# Patient Record
Sex: Male | Born: 1985 | Race: Black or African American | Hispanic: No | Smoking: Never smoker
Health system: Southern US, Community
[De-identification: ages and names within clinical notes are randomized; demographics above are authoritative.]

## PROBLEM LIST (undated history)

## (undated) VITALS — BP 145/83 | HR 63 | Temp 98.5°F | Resp 12

## (undated) DIAGNOSIS — F4310 Post-traumatic stress disorder, unspecified: Secondary | ICD-10-CM

## (undated) DIAGNOSIS — G43909 Migraine, unspecified, not intractable, without status migrainosus: Secondary | ICD-10-CM

## (undated) DIAGNOSIS — M419 Scoliosis, unspecified: Secondary | ICD-10-CM

## (undated) HISTORY — PX: NO PAST SURGERIES: SHX2092

---

## 2016-11-25 ENCOUNTER — Emergency Department
Admission: EM | Admit: 2016-11-25 | Discharge: 2016-11-25 | Disposition: A | Discharge status: Home / Self Care | Payer: Self-pay | Attending: Emergency Medicine | Admitting: Emergency Medicine

## 2016-11-25 ENCOUNTER — Other Ambulatory Visit: Payer: Self-pay

## 2016-11-25 ENCOUNTER — Encounter: Payer: Self-pay | Admitting: *Deleted

## 2016-11-25 DIAGNOSIS — Y9389 Activity, other specified: Secondary | ICD-10-CM | POA: Insufficient documentation

## 2016-11-25 DIAGNOSIS — Y999 Unspecified external cause status: Secondary | ICD-10-CM | POA: Diagnosis not present

## 2016-11-25 DIAGNOSIS — S161XXA Strain of muscle, fascia and tendon at neck level, initial encounter: Secondary | ICD-10-CM | POA: Insufficient documentation

## 2016-11-25 DIAGNOSIS — Y92410 Unspecified street and highway as the place of occurrence of the external cause: Secondary | ICD-10-CM | POA: Diagnosis not present

## 2016-11-25 DIAGNOSIS — S199XXA Unspecified injury of neck, initial encounter: Secondary | ICD-10-CM | POA: Diagnosis present

## 2016-11-25 HISTORY — DX: Scoliosis, unspecified: M41.9

## 2016-11-25 MED ORDER — CYCLOBENZAPRINE HCL 5 MG PO TABS: 5.0000 mg | ORAL_TABLET | Freq: Three times a day (TID) | ORAL | 0 refills | Status: DC | PRN

## 2016-11-25 MED ORDER — IBUPROFEN 800 MG PO TABS: 800.0000 mg | ORAL_TABLET | Freq: Three times a day (TID) | ORAL | 0 refills | Status: DC | PRN
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
## 2016-11-25 NOTE — Discharge Instructions (Signed)
Please take medications as prescribed.  Ice cervical spine muscles 20 minutes every hour for the next 2 days then transition over to heat.  Follow-up with orthopedics if no improvement in 5-7 days.
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
## 2016-12-09 ENCOUNTER — Ambulatory Visit
Admission: RE | Admit: 2016-12-09 | Discharge: 2016-12-09 | Disposition: A | Discharge status: Home / Self Care | Payer: No Typology Code available for payment source | Source: Ambulatory Visit | Attending: Chiropractor | Admitting: Chiropractor

## 2016-12-09 ENCOUNTER — Other Ambulatory Visit: Payer: Self-pay | Admitting: Chiropractor

## 2016-12-09 DIAGNOSIS — M419 Scoliosis, unspecified: Secondary | ICD-10-CM | POA: Insufficient documentation

## 2016-12-09 DIAGNOSIS — R52 Pain, unspecified: Secondary | ICD-10-CM | POA: Diagnosis not present

## 2016-12-24 ENCOUNTER — Encounter: Payer: Self-pay | Admitting: Emergency Medicine

## 2016-12-24 ENCOUNTER — Other Ambulatory Visit: Payer: Self-pay

## 2016-12-24 ENCOUNTER — Emergency Department
Admission: EM | Admit: 2016-12-24 | Discharge: 2016-12-24 | Disposition: A | Discharge status: Home / Self Care | Payer: No Typology Code available for payment source | Attending: Emergency Medicine | Admitting: Emergency Medicine

## 2016-12-24 DIAGNOSIS — M549 Dorsalgia, unspecified: Secondary | ICD-10-CM | POA: Diagnosis present

## 2016-12-24 DIAGNOSIS — M62830 Muscle spasm of back: Secondary | ICD-10-CM | POA: Insufficient documentation

## 2016-12-24 MED ORDER — HYDROCODONE-ACETAMINOPHEN 5-325 MG PO TABS
2.0000 | ORAL_TABLET | Freq: Once | ORAL | Status: AC
  Administered 2016-12-24: 2 via ORAL
  Filled 2016-12-24: qty 2

## 2016-12-24 MED ORDER — ORPHENADRINE CITRATE 30 MG/ML IJ SOLN
60.0000 mg | Freq: Two times a day (BID) | INTRAMUSCULAR | Status: DC
  Administered 2016-12-24: 60 mg via INTRAMUSCULAR
  Filled 2016-12-24: qty 2

## 2016-12-24 MED ORDER — METHOCARBAMOL 500 MG PO TABS: 1000.0000 mg | ORAL_TABLET | Freq: Four times a day (QID) | ORAL | 0 refills | Status: DC

## 2016-12-24 MED ORDER — HYDROCODONE-ACETAMINOPHEN 5-325 MG PO TABS: 1.0000 | ORAL_TABLET | Freq: Four times a day (QID) | ORAL | 0 refills | Status: DC | PRN
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
## 2016-12-24 NOTE — ED Notes (Signed)

## 2017-08-29 ENCOUNTER — Emergency Department
Admission: EM | Admit: 2017-08-29 | Discharge: 2017-08-29 | Disposition: A | Discharge status: Home / Self Care | Payer: Self-pay | Attending: Emergency Medicine | Admitting: Emergency Medicine

## 2017-08-29 ENCOUNTER — Encounter: Payer: Self-pay | Admitting: Emergency Medicine

## 2017-08-29 ENCOUNTER — Emergency Department: Payer: Self-pay

## 2017-08-29 DIAGNOSIS — R0789 Other chest pain: Secondary | ICD-10-CM | POA: Insufficient documentation

## 2017-08-29 LAB — CBC WITH DIFFERENTIAL/PLATELET
Basophils Absolute: 0 10*3/uL (ref 0–0.1)
Basophils Relative: 1 %
Eosinophils Absolute: 0.1 10*3/uL (ref 0–0.7)
Eosinophils Relative: 1 %
HCT: 44.1 % (ref 40.0–52.0)
HEMOGLOBIN: 14.8 g/dL (ref 13.0–18.0)
LYMPHS ABS: 2.2 10*3/uL (ref 1.0–3.6)
Lymphocytes Relative: 36 %
MCH: 24.2 pg — AB (ref 26.0–34.0)
MCHC: 33.5 g/dL (ref 32.0–36.0)
MCV: 72.2 fL — AB (ref 80.0–100.0)
MONOS PCT: 10 %
Monocytes Absolute: 0.6 10*3/uL (ref 0.2–1.0)
NEUTROS PCT: 52 %
Neutro Abs: 3.2 10*3/uL (ref 1.4–6.5)
PLATELETS: 158 10*3/uL (ref 150–440)
RBC: 6.11 MIL/uL — AB (ref 4.40–5.90)
RDW: 14.5 % (ref 11.5–14.5)
WBC: 6.1 10*3/uL (ref 3.8–10.6)

## 2017-08-29 LAB — COMPREHENSIVE METABOLIC PANEL
ALBUMIN: 4.2 g/dL (ref 3.5–5.0)
ALT: 12 U/L (ref 0–44)
ANION GAP: 7 (ref 5–15)
AST: 19 U/L (ref 15–41)
Alkaline Phosphatase: 80 U/L (ref 38–126)
BUN: 16 mg/dL (ref 6–20)
CHLORIDE: 105 mmol/L (ref 98–111)
CO2: 26 mmol/L (ref 22–32)
Calcium: 9.6 mg/dL (ref 8.9–10.3)
Creatinine, Ser: 1.12 mg/dL (ref 0.61–1.24)
GFR calc Af Amer: >60 mL/min (ref >60)
GFR calc non Af Amer: >60 mL/min (ref >60)
GLUCOSE: 90 mg/dL (ref 70–99)
Potassium: 3.8 mmol/L (ref 3.5–5.1)
SODIUM: 138 mmol/L (ref 135–145)
Total Bilirubin: 0.6 mg/dL (ref 0.3–1.2)
Total Protein: 8 g/dL (ref 6.5–8.1)

## 2017-08-29 LAB — TROPONIN I: Troponin I: <0.03 ng/mL (ref <0.03)

## 2017-08-29 MED ORDER — DICLOFENAC SODIUM 1 % TD GEL: TRANSDERMAL | 0 refills | Status: DC

## 2017-08-29 MED ORDER — KETOROLAC TROMETHAMINE 30 MG/ML IJ SOLN
30.0000 mg | Freq: Once | INTRAMUSCULAR | Status: AC
  Administered 2017-08-29: 30 mg via INTRAMUSCULAR
  Filled 2017-08-29: qty 1
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
## 2018-08-29 ENCOUNTER — Other Ambulatory Visit: Payer: Self-pay

## 2018-08-29 DIAGNOSIS — Z20822 Contact with and (suspected) exposure to covid-19: Secondary | ICD-10-CM

## 2018-08-30 LAB — NOVEL CORONAVIRUS, NAA: SARS-CoV-2, NAA: NOT DETECTED

## 2019-01-08 ENCOUNTER — Ambulatory Visit: Payer: Self-pay | Attending: Internal Medicine

## 2019-01-08 DIAGNOSIS — Z20822 Contact with and (suspected) exposure to covid-19: Secondary | ICD-10-CM

## 2019-01-10 LAB — NOVEL CORONAVIRUS, NAA: SARS-CoV-2, NAA: NOT DETECTED
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
## 2019-07-18 NOTE — Progress Notes (Signed)
07/19/2019 11:31 AM   Sean King 08/21/1985 [id number]  Referring provider: No referring provider defined for this encounter. Chief Complaint  Patient presents with   VAS Consult    HPI: Sean King is a 34 y.o. male who presents today for a vasectomy consult.   -Married without children -States he and wife desire vasectomy as a means of permanent sterilization -Known history epididymitis/chronic scrotal pain -Denies any prior urological symptoms/surgeries. -No history of bleeding or clotting disorders   PMH: Past Medical History:  Diagnosis Date   Scoliosis     Surgical History: No past surgical history on file.  Home Medications:  Allergies as of 07/19/2019      Reactions   Coconut Oil    Hydrocortisone Hives   Neosporin [neomycin-bacitracin Zn-polymyx] Rash      Medication List       Accurate as of July 19, 2019 11:31 AM. If you have any questions, ask your nurse or doctor.        STOP taking these medications   HYDROcodone-acetaminophen 5-325 MG tablet Commonly known as: NORCO/VICODIN Stopped by: Riki Altes, MD     TAKE these medications   diclofenac sodium 1 % Gel Commonly known as: VOLTAREN Apply to affected area 3-4 times per day as needed for pain.   methocarbamol 500 MG tablet Commonly known as: Robaxin Take 2 tablets (1,000 mg total) by mouth 4 (four) times daily.       Allergies:  Allergies  Allergen Reactions   Coconut Oil    Hydrocortisone Hives   Neosporin [Neomycin-Bacitracin Zn-Polymyx] Rash    Family History: No family history on file.  Social History:  reports that he has never smoked. He has never used smokeless tobacco. He reports current alcohol use. He reports that he does not use drugs.   Physical Exam: BP (!) 155/104    Pulse 87    Ht 6' (1.829 m)    Wt 247 lb (112 kg)    BMI 33.50 kg/m   Constitutional:  Alert and oriented, No acute distress. HEENT: [place] AT, moist mucus membranes.  Trachea  midline, no masses. Cardiovascular: No clubbing, cyanosis, or edema. Respiratory: Normal respiratory effort, no increased work of breathing. GU: Phallus without lesions, testes descended bilaterally without masses or tenderness, spermatic cord/epididymis palpably normal bilaterally.  Vasa easily palpable. Skin: No rashes, bruises or suspicious lesions. Neurologic: Grossly intact, no focal deficits, moving all 4 extremities. Psychiatric: Normal mood and affect.   Assessment & Plan:    We had a long discussion about vasectomy. We specifically discussed the procedure, recovery and the risks, benefits and alternatives of vasectomy. I explained that the procedure entails removal of a segment of each vas deferens, each of which conducts sperm, and that the purpose of this procedure is to cause sterility (inability to produce children or cause pregnancy). Vasectomy is intended to be permanent and irreversible form of contraception. Options for fertility after vasectomy include vasectomy reversal, or sperm retrieval with in vitro fertilization. These options are not always successful, and they may be expensive. We discussed reversible forms of birth control such as condoms, IUD or diaphragms, as well as the option of freezing sperm in a sperm bank prior to the vasectomy procedure. We discussed the importance of avoiding strenuous exercise for four days after vasectomy, and the importance of refraining from any form of ejaculation for seven days after vasectomy. I explained that vasectomy does not produce immediate sterility so another form of contraceptive must be  used until sterility is assured by having semen checked for sperm. Thus, a post vasectomy semen analysis is necessary to confirm sterility. Rarely, vasectomy must be repeated. We discussed the approximately 1 in 2,000 risk of pregnancy after vasectomy for men who have post-vasectomy semen analysis showing absent sperm or rare non-motile sperm. Typical  side effects include a small amount of oozing blood, some discomfort and mild swelling in the area of incision.  Vasectomy does not affect sexual performance, function, please, sensation, interest, desire, satisfaction, penile erection, volume of semen or ejaculation. Other rare risks include allergy or adverse reaction to an anesthetic, testicular atrophy, hematoma, infection/abscess, prolonged tenderness of the vas deferens, pain, swelling, painful nodule or scar (called sperm granuloma) or epididymtis. We discussed chronic testicular pain syndrome. This has been reported to occur in as many as 1-2% of men and may be permanent. This can be treated with medication, small procedures or (rarely) surgery.  He indicated he would call back if preprocedure anxiolytic is desired  [facility] [address] [place], [place] [address] [phone]  I, Theador Hawthorne, am acting as a scribe for Dr. Lorin Picket C. Brenna Friesenhahn,  I have reviewed the above documentation for accuracy and completeness, and I agree with the above.    Riki Altes, MD

## 2019-07-19 ENCOUNTER — Ambulatory Visit: Payer: BC Managed Care – PPO | Admitting: Urology

## 2019-07-19 ENCOUNTER — Encounter: Payer: Self-pay | Admitting: Urology

## 2019-07-19 ENCOUNTER — Other Ambulatory Visit: Payer: Self-pay

## 2019-07-19 VITALS — BP 155/104 | HR 87 | Ht 72.0 in | Wt 247.0 lb

## 2019-07-19 DIAGNOSIS — Z3009 Encounter for other general counseling and advice on contraception: Secondary | ICD-10-CM | POA: Diagnosis not present

## 2019-07-19 NOTE — Patient Instructions (Signed)

## 2019-07-21 ENCOUNTER — Encounter: Payer: Self-pay | Admitting: Urology

## 2019-08-29 ENCOUNTER — Encounter: Payer: Self-pay | Admitting: Urology

## 2019-09-05 ENCOUNTER — Encounter: Payer: Self-pay | Admitting: Urology

## 2019-11-08 ENCOUNTER — Encounter: Payer: BC Managed Care – PPO | Admitting: Urology

## 2019-11-19 ENCOUNTER — Emergency Department (HOSPITAL_COMMUNITY)
Admission: EM | Admit: 2019-11-19 | Discharge: 2019-11-19 | Disposition: A | Discharge status: Home / Self Care | Payer: BC Managed Care – PPO | Attending: Emergency Medicine | Admitting: Emergency Medicine

## 2019-11-19 ENCOUNTER — Other Ambulatory Visit: Payer: Self-pay

## 2019-11-19 ENCOUNTER — Encounter (HOSPITAL_COMMUNITY): Payer: Self-pay

## 2019-11-19 DIAGNOSIS — S3992XA Unspecified injury of lower back, initial encounter: Secondary | ICD-10-CM | POA: Diagnosis present

## 2019-11-19 DIAGNOSIS — X58XXXA Exposure to other specified factors, initial encounter: Secondary | ICD-10-CM | POA: Diagnosis not present

## 2019-11-19 DIAGNOSIS — S29019A Strain of muscle and tendon of unspecified wall of thorax, initial encounter: Secondary | ICD-10-CM

## 2019-11-19 DIAGNOSIS — S29012A Strain of muscle and tendon of back wall of thorax, initial encounter: Secondary | ICD-10-CM | POA: Insufficient documentation

## 2019-11-19 HISTORY — DX: Post-traumatic stress disorder, unspecified: F43.10

## 2019-11-19 MED ORDER — NAPROXEN 375 MG PO TABS: 375.0000 mg | ORAL_TABLET | Freq: Two times a day (BID) | ORAL | 0 refills | Status: DC | PRN

## 2019-11-19 MED ORDER — METHOCARBAMOL 750 MG PO TABS: 1500.0000 mg | ORAL_TABLET | Freq: Three times a day (TID) | ORAL | 0 refills | Status: DC | PRN

## 2019-11-19 MED ORDER — OXYCODONE-ACETAMINOPHEN 5-325 MG PO TABS
1.0000 | ORAL_TABLET | Freq: Once | ORAL | Status: AC
  Administered 2019-11-19: 1 via ORAL
  Filled 2019-11-19: qty 1

## 2019-11-19 MED ORDER — KETOROLAC TROMETHAMINE 60 MG/2ML IM SOLN
60.0000 mg | Freq: Once | INTRAMUSCULAR | Status: AC
  Administered 2019-11-19: 60 mg via INTRAMUSCULAR
  Filled 2019-11-19: qty 2

## 2019-11-19 NOTE — ED Triage Notes (Signed)
Pt reports 5 days of left upper back pain, hx of scoliosis. Pt tried icy hot without relief. Pt ambulatory.

## 2019-11-19 NOTE — ED Provider Notes (Signed)
[facility] EMERGENCY DEPARTMENT Provider Note   CSN: [id number] Arrival date & time: 11/19/19  1634     History Chief Complaint  Patient presents with  . Back Pain    Sean King is a 34 y.o. male.  The history is provided by the patient and medical records.  Back Pain  Sean King is a 34 y.o. male who presents to the Emergency Department complaining of back pain. He presents the emergency department complaining of five days of acute on chronic upper back pain. He has a history of scoliosis with recurrent back pain. Over the last several days he is noticed worsening of this pain. He has a sharp pain at the base of his neck that radiates down to his scapula. He feels like there is a ball underneath the scapula. He denies any associated fevers, nausea, vomiting, chest pain, shortness of breath, numbness, weakness. He is not trying thing at home for pain. He has been seen previously for similar episodes and has been prescribed muscle relaxers but feels like these make him sleepy.    Past Medical History:  Diagnosis Date  . PTSD (post-traumatic stress disorder)   . Scoliosis     There are no problems to display for this patient.   History reviewed. No pertinent surgical history.     No family history on file.  Social History   Tobacco Use  . Smoking status: Never Smoker  . Smokeless tobacco: Never Used  Vaping Use  . Vaping Use: Never used  Substance Use Topics  . Alcohol use: Yes    Comment: sparingly  . Drug use: No    Home Medications Prior to Admission medications   Medication Sig Start Date End Date Taking? Authorizing Provider  Menthol, Topical Analgesic, (ICY HOT BACK EX) Apply 1 application topically daily as needed (For back pain).   Yes [provider]  diclofenac sodium (VOLTAREN) 1 % GEL Apply to affected area 3-4 times per day as needed for pain. Patient not taking: Reported on 11/19/2019 08/29/17   Myrna Blazer, MD  methocarbamol (ROBAXIN) 750 MG tablet Take 2 tablets (1,500 mg total) by mouth every 8 (eight) hours as needed for muscle spasms. 11/19/19   Tilden Fossa, MD  naproxen (NAPROSYN) 375 MG tablet Take 1 tablet (375 mg total) by mouth 2 (two) times daily as needed. 11/19/19   Tilden Fossa, MD    Allergies    Coconut oil, Hydrocortisone, and Neosporin [neomycin-bacitracin zn-polymyx]  Review of Systems   Review of Systems  Musculoskeletal: Positive for back pain.  All other systems reviewed and are negative.   Physical Exam Updated Vital Signs BP (!) 166/98 (BP Location: Left Arm)   Pulse 66   Temp 98.1 F (36.7 C) (Oral)   Resp 18   Ht 6' (1.829 m)   Wt 108.9 kg   SpO2 98%   BMI 32.55 kg/m   Physical Exam Vitals and nursing note reviewed.  Constitutional:      Appearance: He is well-developed.  HENT:     Head: Normocephalic and atraumatic.  Cardiovascular:     Rate and Rhythm: Normal rate and regular rhythm.     Heart sounds: No murmur heard.   Pulmonary:     Effort: Pulmonary effort is normal. No respiratory distress.     Breath sounds: Normal breath sounds.  Abdominal:     Palpations: Abdomen is soft.     Tenderness: There is no abdominal tenderness. There is no  guarding or rebound.  Musculoskeletal:     Comments: No focal point bony tenderness over the cervical, thoracic, lumbar spine. There is mild tenderness to palpation just inferior to the left scapula. Range of motion intact in bilateral shoulders.  Skin:    General: Skin is warm and dry.  Neurological:     Mental Status: He is alert and oriented to person, place, and time.     Comments: Five out of five strength in all four extremities with sensation to light touch intact in all four extremities  Psychiatric:        Behavior: Behavior normal.     ED Results / Procedures / Treatments   Labs (all labs ordered are listed, but only abnormal results are displayed) Labs Reviewed - No data to  display  EKG None  Radiology No results found.  Procedures Procedures (including critical care time)  Medications Ordered in ED Medications  ketorolac (TORADOL) injection 60 mg (has no administration in time range)  oxyCODONE-acetaminophen (PERCOCET/ROXICET) 5-325 MG per tablet 1 tablet (1 tablet Oral Given 11/19/19 1646)    ED Course  I have reviewed the triage vital signs and the nursing notes.  Pertinent labs & imaging results that were available during my care of the patient were reviewed by me and considered in my medical decision making (see chart for details).    MDM Rules/Calculators/A&P                         patient here for evaluation of left sided thoracic back pain. Pain is reproducible on examination. He has no focal neurologic deficits. Presentation is not consistent with PE, pneumothorax, epidural abscess, dissection. Discussed with patient home care for musculoskeletal back pain. Discussed outpatient follow-up and return precautions.  Final Clinical Impression(s) / ED Diagnoses Final diagnoses:  Thoracic myofascial strain, initial encounter    Rx / DC Orders ED Discharge Orders         Ordered    naproxen (NAPROSYN) 375 MG tablet  2 times daily PRN        11/19/19 1849    methocarbamol (ROBAXIN) 750 MG tablet  Every 8 hours PRN        11/19/19 1849           Tilden Fossa, MD 11/19/19 [phone]

## 2019-11-19 NOTE — ED Notes (Signed)
Patient verbalizes understanding of discharge instructions. Opportunity for questioning and answers were provided. Arm band removed by staff, patient discharged from ED. 

## 2019-12-02 ENCOUNTER — Telehealth: Payer: Self-pay | Admitting: Family Medicine

## 2019-12-02 NOTE — Telephone Encounter (Signed)
Per patient not a good time to scheduled , appt declined for now but states will call our office when available for f/u--glh

## 2020-01-04 ENCOUNTER — Telehealth: Payer: BC Managed Care – PPO | Admitting: Nurse Practitioner

## 2020-01-04 DIAGNOSIS — Z20822 Contact with and (suspected) exposure to covid-19: Secondary | ICD-10-CM

## 2020-01-04 NOTE — Progress Notes (Signed)
E-Visit for Corona Virus Screening  Your current symptoms could be consistent with the coronavirus.  Many health care providers can now test patients at their office but not all are.  [facility] has multiple testing sites. For information on our COVID testing locations and hours go to [email or web address]    Testing Information: The COVID-19 Community Testing sites are testing BY APPOINTMENT ONLY.  You can schedule online at [email or web address]  If you do not have access to a smart phone or computer you may call [phone] for an appointment.   Additional testing sites in the Community:  . For [facility] Testing sites in [place]  https://www.cvs.com/minuteclinic/covid-19-testing  . For Pop-up testing sites in   https://covid19.ncdhhs.gov/about-covid-19/testing/find-my-testing-place/pop-testing-sites  . For [facility] https://[place].gov[place]/human-services/health-department/coronavirus-covid-19-info/covid-19-testing  . For [place] testing in [place] https://www.[place].gov/[place]/human-services/health-department/coronavirus-covid-19-info/covid-19-testing  . For [facility] testing in [place]   https://[facility].care/covidtesting  For  more information about community testing call [phone]   Please quarantine yourself while awaiting your test results. Please stay home for a minimum of 10 days from the first day of illness with improving symptoms and you have had 24 hours of no fever (without the use of Tylenol (Acetaminophen) Motrin (Ibuprofen) or any fever reducing medication).  Also - Do not get tested prior to returning to work because once you have had a positive test the test can stay positive for more than a month in some cases.   You should wear a mask or cloth face covering over your nose and mouth if you must be around other people or animals, including pets (even at home). Try  to stay at least 6 feet away from other people. This will protect the people around you.  Please continue good preventive care measures, including:  frequent hand-washing, avoid touching your face, cover coughs/sneezes, stay out of crowds and keep a 6 foot distance from others.  COVID-19 is a respiratory illness with symptoms that are similar to the flu. Symptoms are typically mild to moderate, but there have been cases of severe illness and death due to the virus.   The following symptoms may appear 2-14 days after exposure: . Fever . Cough . Shortness of breath or difficulty breathing . Chills . Repeated shaking with chills . Muscle pain . Headache . Sore throat . New loss of taste or smell . Fatigue . Congestion or runny nose . Nausea or vomiting . Diarrhea  Go to the nearest hospital ED for assessment if fever/cough/breathlessness are severe or illness seems like a threat to life.  It is vitally important that if you feel that you have an infection such as this virus or any other virus that you stay home and away from places where you may spread it to others.  You should avoid contact with people age 65 and older.   You can use medication such as A prescription cough medication called Tessalon Perles 100 mg. You may take 1-2 capsules every 8 hours as needed for cough  You may also take acetaminophen (Tylenol) as needed for fever.  Reduce your risk of any infection by using the same precautions used for avoiding the common cold or flu:  . Wash your hands often with soap and warm water for at least 20 seconds.  If soap and water are not readily available, use an alcohol-based hand sanitizer with at least 60% alcohol.  . If coughing or sneezing, cover your mouth and nose by coughing or sneezing into the elbow areas of   your shirt or coat, into a tissue or into your sleeve (not your hands). . Avoid shaking hands with others and consider head nods or verbal greetings only. . Avoid touching  your eyes, nose, or mouth with unwashed hands.  . Avoid close contact with people who are sick. . Avoid places or events with large numbers of people in one location, like concerts or sporting events. . Carefully consider travel plans you have or are making. . If you are planning any travel outside or inside the [place], visit the CDC's Travelers' Health webpage for the latest health notices. . If you have some symptoms but not all symptoms, continue to monitor at home and seek medical attention if your symptoms worsen. . If you are having a medical emergency, call 911.  HOME CARE . Only take medications as instructed by your medical team. . Drink plenty of fluids and get plenty of rest. . A steam or ultrasonic humidifier can help if you have congestion.   GET HELP RIGHT AWAY IF YOU HAVE EMERGENCY WARNING SIGNS** FOR COVID-19. If you or someone is showing any of these signs seek emergency medical care immediately. Call 911 or proceed to your closest emergency facility if: . You develop worsening high fever. . Trouble breathing . Bluish lips or face . Persistent pain or pressure in the chest . New confusion . Inability to wake or stay awake . You cough up blood. . Your symptoms become more severe  **This list is not all possible symptoms. Contact your medical provider for any symptoms that are sever or concerning to you.  MAKE SURE YOU   Understand these instructions.  Will watch your condition.  Will get help right away if you are not doing well or get worse.  Your e-visit answers were reviewed by a board certified advanced clinical practitioner to complete your personal care plan.  Depending on the condition, your plan could have included both over the counter or prescription medications.  If there is a problem please reply once you have received a response from your provider.  Your safety is important to us.  If you have drug allergies check your prescription carefully.    You can  use MyChart to ask questions about today's visit, request a non-urgent call back, or ask for a work or school excuse for 24 hours related to this e-Visit. If it has been greater than 24 hours you will need to follow up with your provider, or enter a new e-Visit to address those concerns. You will get an e-mail in the next two days asking about your experience.  I hope that your e-visit has been valuable and will speed your recovery. Thank you for using e-visits.  5-10 minutes spent reviewing and documenting in chart.   

## 2020-05-06 ENCOUNTER — Encounter: Payer: Self-pay | Admitting: Physician Assistant

## 2020-05-06 ENCOUNTER — Other Ambulatory Visit: Payer: Self-pay

## 2020-05-06 ENCOUNTER — Ambulatory Visit: Payer: Self-pay | Admitting: Physician Assistant

## 2020-05-06 DIAGNOSIS — Z113 Encounter for screening for infections with a predominantly sexual mode of transmission: Secondary | ICD-10-CM

## 2020-05-06 LAB — GRAM STAIN

## 2020-05-06 NOTE — Progress Notes (Signed)
   [facility] Department STI clinic/screening visit  Subjective:  Sean King is a 35 y.o. male being seen today for an STI screening visit. The patient reports they do not have symptoms.    Patient has the following medical conditions:  There are no problems to display for this patient.    Chief Complaint  Patient presents with  . SEXUALLY TRANSMITTED DISEASE    screening    HPI  Patient reports that he is not having any symptoms but would like a screening today.  Denies chronic conditions, surgeries and regular medicines.  States last HIV test was 2 years ago and las void prior to sample collection for Gram stain was over 2 hr ago.   See flowsheet for further details and programmatic requirements.    The following portions of the patient's history were reviewed and updated as appropriate: allergies, current medications, past medical history, past social history, past surgical history and problem list.  Objective:  There were no vitals filed for this visit.  Physical Exam Constitutional:      General: He is not in acute distress.    Appearance: Normal appearance.  HENT:     Head: Normocephalic and atraumatic.     Comments: No nits,lice, or hair loss. No cervical, supraclavicular or axillary adenopathy.    Mouth/Throat:     Mouth: Mucous membranes are moist.     Pharynx: Oropharynx is clear. No oropharyngeal exudate or posterior oropharyngeal erythema.  Eyes:     Conjunctiva/sclera: Conjunctivae normal.  Pulmonary:     Effort: Pulmonary effort is normal.  Abdominal:     Palpations: Abdomen is soft. There is no mass.     Tenderness: There is no abdominal tenderness. There is no guarding or rebound.  Genitourinary:    Penis: Normal.      Testes: Normal.     Comments: Pubic area without nits, lice, hair loss, edema, erythema, lesions and inguinal adenopathy. Penis circumcised without rash, lesions and discharge at meatus. Testicles descended  bilaterally,nt, no masses or edema. Musculoskeletal:     Cervical back: Neck supple. No tenderness.  Skin:    General: Skin is warm and dry.     Findings: No bruising, erythema, lesion or rash.  Neurological:     Mental Status: He is alert and oriented to person, place, and time.  Psychiatric:        Mood and Affect: Mood normal.        Behavior: Behavior normal.        Thought Content: Thought content normal.        Judgment: Judgment normal.       Assessment and Plan:  Sean King is a 35 y.o. male presenting to the [facility] for STI screening  1. Screening for STD (sexually transmitted disease) Patient into clinic without symptoms. Reviewed Gram stain results with patient and no treatment is indicated today. Rec condoms with all sex. Await test results.  Counseled that RN will call if needs to RTC for treatment once results are back. - Gram stain - Gonococcus culture - HIV [facility] [facility] - Syphilis Serology, [place] [facility] - Gonococcus culture     No follow-ups on file.  No future appointments.  Sean Holmes, PA

## 2020-05-10 LAB — GONOCOCCUS CULTURE

## 2020-05-12 LAB — HM HIV SCREENING LAB: HM HIV Screening: NEGATIVE

## 2021-05-06 ENCOUNTER — Ambulatory Visit (HOSPITAL_COMMUNITY)
Admission: RE | Admit: 2021-05-06 | Discharge: 2021-05-06 | Disposition: A | Discharge status: Home / Self Care | Payer: Self-pay | Attending: Psychiatry | Admitting: Psychiatry

## 2021-05-06 NOTE — H&P (Signed)
Behavioral Health Medical Screening Exam ? ?Sean King is an 36 y.o. male patient with no documented past psychiatric history who presented to [facility] alone voluntarily for assessment. Patient reports history of PTSD and anxiety. He reports decreased sleep, some feelings of guilt and worthlessness, and inconsistent appetite; Denies any anhedonia, low energy, or thoughts of suicide.  Reports upcoming divorce hearing in two weeks that he states is grossly affecting his functioning.  ? 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 States therapist [name] (virtual) has recently recommended x2/week due to increased anxiety and worry, but he is not able to afford it. Denies any active substance use. Denies any active or history or self harm, suicidal or homicidal ideations, auditory or visual hallucinations. Provider discussed [facility] resources, open acces process; patient expressed interest, requesting discharge to follow up outpatient.  ? ?Total Time spent with patient: 20 minutes ? ?Psychiatric Specialty Exam: ?Physical Exam ?Vitals and nursing note reviewed.  ?Constitutional:   ?   Appearance: Normal appearance. He is normal weight. He is not ill-appearing or toxic-appearing.  ?HENT:  ?   Head: Normocephalic.  ?   Nose: Nose normal.  ?   Mouth/Throat:  ?   Mouth: Mucous membranes are moist.  ?   Pharynx: Oropharynx is clear.  ?Eyes:  ?   Pupils: Pupils are equal, round, and reactive to light.  ?Cardiovascular:  ?   Rate and Rhythm: Normal rate.  ?   Pulses: Normal pulses.  ?Pulmonary:  ?   Effort: Pulmonary effort is normal.  ?Abdominal:  ?   General: Abdomen is flat.  ?Musculoskeletal:     ?   General: Normal range of motion.  ?   Cervical back: Normal range of motion.  ?Skin: ?   General: Skin is warm and dry.  ?Neurological:  ?   Mental Status: He is alert and oriented to person, place, and time. Mental status is at baseline.   ?Psychiatric:     ?   Attention and Perception: Attention and perception normal.     ?   Mood and Affect: Mood and affect normal.     ?   Speech: Speech normal.     ?   Behavior: Behavior is cooperative.     ?   Thought Content: Thought content is not paranoid or delusional. Thought content does not include homicidal or suicidal ideation. Thought content does not include homicidal or suicidal plan.     ?   Cognition and Memory: Cognition and memory normal.     ?   Judgment: Judgment normal.  ? ?Review of Systems  ?Psychiatric/Behavioral:  Positive for sleep disturbance. Negative for suicidal ideas.   ?All other systems reviewed and are negative. ?There were no vitals taken for this visit.There is no height or weight on file to calculate BMI. ?General Appearance: Casual ?Eye Contact:  Fair ?Speech:  Clear and Coherent ?Volume:  Normal ?Mood:  Euthymic ?Affect:  Congruent ?Thought Process:  Coherent ?Orientation:  Full (Time, Place, and Person) ?Thought Content:  Logical ?Suicidal Thoughts:  No ?Homicidal Thoughts:  No ?Memory:  Immediate;   Fair ?Recent;   Fair ?Judgement:  Fair ?Insight:  Fair ?Psychomotor Activity:  Normal ?Concentration: Concentration: Fair and Attention Span: Fair ?Recall:  Fair ?Fund of Knowledge:Fair ?Language: Fair ?Akathisia:  NA ?Handed:  Right ?AIMS (if indicated):    ?Assets:  Communication Skills ?Desire for Improvement ?Financial Resources/Insurance ?Housing ?Physical Health ?Resilience ?  Social Support ?Transportation ?Sleep:    ? ?Musculoskeletal: ?Strength & Muscle Tone: within normal limits ?Gait & Station: normal ?Patient leans: N/A ? ?There were no vitals taken for this visit. ? ?Recommendations: ?Based on my evaluation the patient does not appear to have an emergency medical condition. ?Provider discussed increasing current services and available outpatient resources via [facility]. Patient contracts for safety and denies any thoughts, intent, or plan to harm himself. Provided resources  for individual follow-up.  ? ?Loletta Parish, NP ?05/06/2021, 2:41 PM ? ?

## 2021-05-10 ENCOUNTER — Ambulatory Visit (HOSPITAL_COMMUNITY)
Admission: EM | Admit: 2021-05-10 | Discharge: 2021-05-11 | Disposition: A | Discharge status: Home / Self Care | Payer: No Payment, Other | Attending: Psychiatry | Admitting: Psychiatry

## 2021-05-10 ENCOUNTER — Ambulatory Visit (HOSPITAL_COMMUNITY)
Admission: RE | Admit: 2021-05-10 | Discharge: 2021-05-10 | Disposition: A | Discharge status: Home / Self Care | Payer: Self-pay | Attending: Psychiatry | Admitting: Psychiatry

## 2021-05-10 DIAGNOSIS — R45850 Homicidal ideations: Secondary | ICD-10-CM

## 2021-05-10 DIAGNOSIS — F32A Depression, unspecified: Secondary | ICD-10-CM

## 2021-05-10 DIAGNOSIS — R440 Auditory hallucinations: Secondary | ICD-10-CM

## 2021-05-10 DIAGNOSIS — R45851 Suicidal ideations: Secondary | ICD-10-CM

## 2021-05-10 LAB — RESP PANEL BY RT-PCR (FLU A&B, COVID) ARPGX2
Influenza A by PCR: NEGATIVE
Influenza B by PCR: NEGATIVE
SARS Coronavirus 2 by RT PCR: NEGATIVE

## 2021-05-10 MED ORDER — HYDROXYZINE HCL 25 MG PO TABS
25.0000 mg | ORAL_TABLET | Freq: Three times a day (TID) | ORAL | Status: DC | PRN
  Administered 2021-05-11: 25 mg via ORAL
  Filled 2021-05-10 (×2): qty 1

## 2021-05-10 MED ORDER — ACETAMINOPHEN 325 MG PO TABS
650.0000 mg | ORAL_TABLET | Freq: Four times a day (QID) | ORAL | Status: DC | PRN
Start: 2021-05-10 — End: 2021-05-11
  Administered 2021-05-11: 650 mg via ORAL
  Filled 2021-05-10: qty 2

## 2021-05-10 MED ORDER — TRAZODONE HCL 50 MG PO TABS: 50.0000 mg | ORAL_TABLET | Freq: Every evening | ORAL | Status: DC | PRN

## 2021-05-10 MED ORDER — ALUM & MAG HYDROXIDE-SIMETH 200-200-20 MG/5ML PO SUSP: 30.0000 mL | ORAL | Status: DC | PRN

## 2021-05-10 MED ORDER — MAGNESIUM HYDROXIDE 400 MG/5ML PO SUSP: 30.0000 mL | Freq: Every day | ORAL | Status: DC | PRN

## 2021-05-10 NOTE — ED Notes (Signed)
POC and PCR  COVID complete just before arrival from [facility] Specimen not repeated ?

## 2021-05-10 NOTE — ED Provider Notes (Signed)
[facility] Continuous Assessment Admission H&P ? ?Date: 05/11/21 ?Patient Name: Sean King ?MRN: [id number] ?Chief Complaint: No chief complaint on file. ?   ? ?Diagnoses:  ?Final diagnoses:  ?Auditory hallucination  ?Homicidal thoughts  ?Suicidal ideation  ?Depression, unspecified depression type  ? ? ?HPI: Sean King, 36 y.o male was a transfer from [facility] or continued evaluation.  Pt is currently going thought a divorce, according to patient he has no seen his wife in close to two year,  and he think he might or he dont know if they have a kid together.  According to patient he think his wife is with one of his long time friend.  Pt report he stay alone but didn't feel safe staying home right now.  Pt report that earlier today he had SI and HI, he had plan to pull up on his wife,  but he know it would not have ended well.  Pt denies he as access to firearm,  states he has access to knives.   ? ?TTS notes:  Pt says that he has been having both SI and HI last night. Only got 45 minutes of sleep then he went to work. Pt says he is going through a divorce which will be finalized on 05/15. He thinks that his soon-to-be ex is sleeping with his "former" best friend. He has been having some thoughts of harming the friend. He does not wish to harm his ex-wife. He has no plan or intention of harming this person though. Pt said he has not seen his ex-wife in two years and they hav eno children that he is aware of. He had a drieam that they had a daughter and it has been worrying him about whether it could be true. Pt has had some SI earlier this morning. He has no plan. He did start taking notes "in my mind" of what I would tell people before he dies. Internal voice telling him to kill himself ? ?Observation of patient,  he is alert and oriented x 4, speech is clear,  mood depressed,  affect flat, congruent with mood.  Pt stated he was SI and HI earlier today without a plan.  He report AH telling him he was better off  dead.  Pt denies VH or paranoia.  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  See currently sees a therapist DInahtran via tele health.  He was diagnose by the therapist with PTSD and Anxiety.  Pt denies smoking and report he drink sparingly once every 2 months.  According to patient he only had 45 minute sleep in the last 24 hours.  Denies taking any medications.   Pt report he can be very impulsive and poises a danger to himself right now.   ? ?PHQ 2-9:    ? ?Total Time spent with patient: 20 minutes ? ?Musculoskeletal  ?Strength & Muscle Tone: within normal limits ?Gait & Station: normal ?Patient leans: N/A ? ?Psychiatric Specialty Exam  ?Presentation ?General Appearance: Appropriate for Environment ? ?Eye Contact:Good ? ?Speech:Clear and Coherent ? ?Speech Volume:Normal ? ?Handedness:Ambidextrous ? ? ?Mood and Affect  ?Mood:Anxious; Depressed ? ?Affect:Appropriate ? ? ?Thought Process  ?Thought Processes:Coherent ? ?Descriptions of Associations:Circumstantial ? ?Orientation:Full (Time, Place and Person) ? ?Thought Content:Abstract Reasoning ? Diagnosis of Schizophrenia or Schizoaffective disorder in past: No ?  ?Hallucinations:Hallucinations: Auditory ?Description of Auditory Hallucinations: kill myself self i would be better off dead ? ?Ideas of Reference:None ? ?Suicidal Thoughts:Suicidal Thoughts: Yes, Passive ?SI  Passive Intent and/or Plan: Without Plan ? ?Homicidal Thoughts:Homicidal Thoughts: Yes, Passive ?HI Passive Intent and/or Plan: Without Plan ? ? ?Sensorium  ?Memory:Immediate Good ? ?Judgment:Fair ? ?Insight:Fair ? ? ?Executive Functions  ?Concentration:Fair ? ?Attention Span:Fair ? ?Recall:Fair ? ?Fund of Knowledge:Fair ? ?Language:Fair ? ? ?Psychomotor Activity  ?Psychomotor Activity:Psychomotor Activity: Normal ? ? ?Assets  ?Assets:Communication Skills; Desire for Improvement; Physical Health; Transportation ? ? ?Sleep  ?Sleep:Sleep: Poor ?Number of Hours of Sleep: 1 ? ? ?Nutritional  Assessment (For OBS and [facility]BC admissions only) ?Has the patient had a decrease in food intake/or appetite?: No ?Does the patient have dental problems?: No ?Does the patient have eating habits or behaviors that may be indicators of an eating disorder including binging or inducing vomiting?: No ?Has the patient recently lost weight without trying?: 0 ?Has the patient been eating poorly because of a decreased appetite?: 0 ?Malnutrition Screening Tool Score: 0 ? ? ? ?Physical Exam ?HENT:  ?   Head: Normocephalic.  ?   Nose: Nose normal.  ?Cardiovascular:  ?   Rate and Rhythm: Normal rate.  ?Pulmonary:  ?   Effort: Pulmonary effort is normal.  ?Musculoskeletal:     ?   General: Normal range of motion.  ?   Cervical back: Normal range of motion.  ?Skin: ?   General: Skin is warm.  ?Neurological:  ?   General: No focal deficit present.  ?   Mental Status: He is alert.  ?Psychiatric:     ?   Mood and Affect: Mood normal.     ?   Behavior: Behavior normal.     ?   Thought Content: Thought content normal.     ?   Judgment: Judgment normal.  ? ?Review of Systems  ?Constitutional: Negative.   ?HENT: Negative.    ?Eyes: Negative.   ?Respiratory: Negative.    ?Cardiovascular: Negative.   ?Gastrointestinal: Negative.   ?Genitourinary: Negative.   ?Musculoskeletal: Negative.   ?Skin: Negative.   ?Neurological: Negative.   ?Endo/Heme/Allergies: Negative.   ?Psychiatric/Behavioral:  Positive for depression, hallucinations and suicidal ideas. The patient is nervous/anxious.   ? ?Blood pressure (!) 152/104, pulse 63, temperature 98.9 ?F (37.2 ?C), temperature source Tympanic, resp. rate 18, SpO2 100 %. There is no height or weight on file to calculate BMI. ? ?Past Psychiatric History: anxiety,  PTSD  ? ?Is the patient at risk to self? Yes  ?Has the patient been a risk to self in the past 6 months? Yes .    ?Has the patient been a risk to self within the distant past? No   ?Is the patient a risk to others? Yes   ?Has the patient been a  risk to others in the past 6 months? Yes   ?Has the patient been a risk to others within the distant past? Yes  ? ?Past Medical History:  ?Past Medical History:  ?Diagnosis Date  ? PTSD (post-traumatic stress disorder)   ? Scoliosis   ? No past surgical history on file. ? ?Family History: No family history on file. ? ?Social History:  ?Social History  ? ?Socioeconomic History  ? Marital status: Legally Separated  ?  Spouse name: Not on file  ? Number of children: Not on file  ? Years of education: Not on file  ? Highest education level: Not on file  ?Occupational History  ? Not on file  ?Tobacco Use  ? Smoking status: Never  ? Smokeless tobacco: Never  ?Vaping Use  ? Vaping  Use: Never used  ?Substance and Sexual Activity  ? Alcohol use: Yes  ?  Comment: sparingly  ? Drug use: No  ? Sexual activity: Yes  ?  Partners: Female  ?  Birth control/protection: Condom  ?Other Topics Concern  ? Not on file  ?Social History Narrative  ? Not on file  ? ?Social Determinants of Health  ? ?Financial Resource Strain: Not on file  ?Food Insecurity: Not on file  ?Transportation Needs: Not on file  ?Physical Activity: Not on file  ?Stress: Not on file  ?Social Connections: Not on file  ?Intimate Partner Violence: Not on file  ? ? ?SDOH:  ?SDOH Screenings  ? ?Alcohol Screen: Not on file  ?Depression (PHQ2-9): Not on file  ?Financial Resource Strain: Not on file  ?Food Insecurity: Not on file  ?Housing: Not on file  ?Physical Activity: Not on file  ?Social Connections: Not on file  ?Stress: Not on file  ?Tobacco Use: Not on file  ?Transportation Needs: Not on file  ? ? ?Last Labs:  ?Admission on 05/10/2021  ?Component Date Value Ref Range Status  ? WBC 05/10/2021 7.6  4.0 - 10.5 K/uL Final  ? RBC 05/10/2021 6.37 (H)  4.22 - 5.81 MIL/uL Final  ? Hemoglobin 05/10/2021 15.8  13.0 - 17.0 g/dL Final  ? HCT 16/10/960405/08/2021 48.2  39.0 - 52.0 % Final  ? MCV 05/10/2021 75.7 (L)  80.0 - 100.0 fL Final  ? MCH 05/10/2021 24.8 (L)  26.0 - 34.0 pg Final   ? MCHC 05/10/2021 32.8  30.0 - 36.0 g/dL Final  ? RDW 54/09/811905/08/2021 13.7  11.5 - 15.5 % Final  ? Platelets 05/10/2021 175  150 - 400 K/uL Final  ? nRBC 05/10/2021 0.0  0.0 - 0.2 % Final  ? Neutrophils Relative

## 2021-05-10 NOTE — H&P (Signed)
Behavioral Health Medical Screening Exam ? ?Sean King is a 36 y.o. male with a reported history of complex PTSD and functional anxiety who presents to [facility] as a walk-in due to experiencing SI and HI last night. Patient was also evaluated at [facility] as a walk-in on 05/06/2021. Patient denies current SI. Denies a history of suicide attempts. Patient reports that he has been separated from his wife since 2020 and that there divorce will be finalized on 05/17/2021. Patient expresses having HI towards a friend who he suspect is having sex with his wife. He states that he does not have evidence that this friend is having sex with his wife. He denies any homicidal intent or plan. Denies acces to guns and knives. Reports sleeping less than 4 hours per night for the past 2 months.   Pt has a thrapst named [name] through on-line telehealth.  Pt has an appt coming up on 05/11. States that he has a referral for medication management and a psychological assessment. Patient reports that he rarely drinks alcohol. States that he last used marijuana in 11/2020. Denies use of other substances. ? ?On evaluation patient is alert and oriented x 4, pleasant, and cooperative. He is well groomed. Eye contact is good. Speech is clear and coherent. Mood is depressed and affect is congruent with mood. Thought process is coherent and thought content is logical. Reports hearing internal voices that state "you should probably kill yourself" and "they think you're gay." No indication that patient is responding to internal stimuli. He states that she is not sure how long he has been hearing the vocies. He states "I think I have been hearing them for a while and they have gradually gotten louder." He reports feeling paranoid that his phone is tapped.  Denies current suicidal ideations. Denies current homicidal ideations. Denies substance abuse.    ? ?Patient has been accepted for transfer to [facility] for continuous  assessment by Sindy Guadeloupe, NP. Patient reviewed with Wyvonnia Dusky, [facility] prior to transfer.  ? ?Total Time spent with patient: 20 minutes ? ?Psychiatric Specialty Exam: ? ?Presentation  ?General Appearance: Appropriate for Environment ? ?Eye Contact:Good ? ?Speech:Clear and Coherent ? ?Speech Volume:Normal ? ?Handedness:Ambidextrous ? ?Mood and Affect  ?Mood:Anxious; Depressed ?Affect:Appropriate ? ? ?Thought Process  ?Thought Processes:Coherent ? ?Descriptions of Associations:Circumstantial ? ?Orientation:Full (Time, Place and Person) ? ?Thought Content:Abstract Reasoning ? ?History of Schizophrenia/Schizoaffective disorder:No ? ?Duration of Psychotic Symptoms:N/A ? ?Hallucinations:Hallucinations: Auditory ?Description of Auditory Hallucinations: kill myself self i would be better off dead ? ?Ideas of Reference:None ? ?Suicidal Thoughts:Suicidal Thoughts: Yes, Passive ?SI Passive Intent and/or Plan: Without Plan ? ?Homicidal Thoughts:Homicidal Thoughts: Yes, Passive ?HI Passive Intent and/or Plan: Without Plan ? ? ?Sensorium  ?Memory:Immediate Good ? ?Judgment:Fair ? ?Insight:Fair ? ? ?Executive Functions  ?Concentration:Fair ? ?Attention Span:Fair ? ?Recall:Fair ? ?Fund of Knowledge:Fair ? ?Language:Fair ? ? ?Psychomotor Activity  ?Psychomotor Activity:Psychomotor Activity: Normal ? ? ?Assets  ?Assets:Communication Skills; Desire for Improvement; Physical Health; Transportation ? ? ?Sleep  ?Sleep:Sleep: Poor ?Number of Hours of Sleep: 1 ? ? ? ?Physical Exam: ?Physical Exam ?Vitals reviewed.  ?Constitutional:   ?   General: He is not in acute distress. ?   Appearance: He is not ill-appearing, toxic-appearing or diaphoretic.  ?HENT:  ?   Right Ear: External ear normal.  ?   Left Ear: External ear normal.  ?Eyes:  ?   Pupils: Pupils are equal, round, and reactive to light.  ?Cardiovascular:  ?  Rate and Rhythm: Normal rate.  ?Pulmonary:  ?   Effort: Pulmonary effort is normal. No respiratory distress.   ?Musculoskeletal:     ?   General: Normal range of motion.  ?Neurological:  ?   Mental Status: He is alert and oriented to person, place, and time.  ?Psychiatric:     ?   Mood and Affect: Mood is anxious and depressed.     ?   Behavior: Behavior is cooperative.     ?   Thought Content: Thought content is paranoid. Thought content does not include homicidal or suicidal ideation.  ? ?Review of Systems  ?Constitutional:  Negative for chills, diaphoresis, fever, malaise/fatigue and weight loss.  ?Cardiovascular:  Negative for chest pain and palpitations.  ?Gastrointestinal:  Negative for diarrhea, nausea and vomiting.  ?Neurological:  Negative for dizziness and seizures.  ?Psychiatric/Behavioral:  Positive for depression, hallucinations and suicidal ideas. Negative for memory loss and substance abuse. The patient is nervous/anxious and has insomnia.   ? ?Blood pressure (!) 158/94, pulse 73, temperature 98.4 ?F (36.9 ?C), temperature source Oral, SpO2 100 %. There is no height or weight on file to calculate BMI. ? ?Musculoskeletal: ?Strength & Muscle Tone: within normal limits ?Gait & Station: normal ?Patient leans: N/A ? ? ?Recommendations: ? ?Based on my evaluation the patient does not appear to have an emergency medical condition. ? ?Jackelyn Poling, NP ?05/11/2021, 5:52 AM ? ?

## 2021-05-11 LAB — COMPREHENSIVE METABOLIC PANEL
ALT: 25 U/L (ref 0–44)
AST: 20 U/L (ref 15–41)
Albumin: 4.4 g/dL (ref 3.5–5.0)
Alkaline Phosphatase: 79 U/L (ref 38–126)
Anion gap: 10 (ref 5–15)
BUN: 11 mg/dL (ref 6–20)
CO2: 25 mmol/L (ref 22–32)
Calcium: 9.7 mg/dL (ref 8.9–10.3)
Chloride: 103 mmol/L (ref 98–111)
Creatinine, Ser: 1.17 mg/dL (ref 0.61–1.24)
GFR, Estimated: >60 mL/min (ref >60)
Glucose, Bld: 86 mg/dL (ref 70–99)
Potassium: 3.8 mmol/L (ref 3.5–5.1)
Sodium: 138 mmol/L (ref 135–145)
Total Bilirubin: 0.8 mg/dL (ref 0.3–1.2)
Total Protein: 8.1 g/dL (ref 6.5–8.1)

## 2021-05-11 LAB — CBC WITH DIFFERENTIAL/PLATELET
Abs Immature Granulocytes: 0.02 10*3/uL (ref 0.00–0.07)
Basophils Absolute: 0 10*3/uL (ref 0.0–0.1)
Basophils Relative: 1 %
Eosinophils Absolute: 0.1 10*3/uL (ref 0.0–0.5)
Eosinophils Relative: 2 %
HCT: 48.2 % (ref 39.0–52.0)
Hemoglobin: 15.8 g/dL (ref 13.0–17.0)
Immature Granulocytes: 0 %
Lymphocytes Relative: 31 %
Lymphs Abs: 2.4 10*3/uL (ref 0.7–4.0)
MCH: 24.8 pg — ABNORMAL LOW (ref 26.0–34.0)
MCHC: 32.8 g/dL (ref 30.0–36.0)
MCV: 75.7 fL — ABNORMAL LOW (ref 80.0–100.0)
Monocytes Absolute: 0.9 10*3/uL (ref 0.1–1.0)
Monocytes Relative: 12 %
Neutro Abs: 4.1 10*3/uL (ref 1.7–7.7)
Neutrophils Relative %: 54 %
Platelets: 175 10*3/uL (ref 150–400)
RBC: 6.37 MIL/uL — ABNORMAL HIGH (ref 4.22–5.81)
RDW: 13.7 % (ref 11.5–15.5)
WBC: 7.6 10*3/uL (ref 4.0–10.5)
nRBC: 0 % (ref 0.0–0.2)

## 2021-05-11 LAB — POCT URINE DRUG SCREEN - MANUAL ENTRY (I-SCREEN)
POC Amphetamine UR: NOT DETECTED
POC Buprenorphine (BUP): NOT DETECTED
POC Cocaine UR: NOT DETECTED
POC Marijuana UR: NOT DETECTED
POC Methadone UR: NOT DETECTED
POC Methamphetamine UR: NOT DETECTED
POC Morphine: NOT DETECTED
POC Oxazepam (BZO): NOT DETECTED
POC Oxycodone UR: NOT DETECTED
POC Secobarbital (BAR): NOT DETECTED

## 2021-05-11 LAB — HEMOGLOBIN A1C
Hgb A1c MFr Bld: 5.4 % (ref 4.8–5.6)
Mean Plasma Glucose: 108.28 mg/dL

## 2021-05-11 LAB — ETHANOL: Alcohol, Ethyl (B): <10 mg/dL (ref <10)

## 2021-05-11 LAB — TSH: TSH: 1.361 u[IU]/mL (ref 0.350–4.500)

## 2021-05-11 NOTE — BH Assessment (Addendum)
Comprehensive Clinical Assessment (CCA) Note  05/11/2021 Sean King [id number] Disposition: Pt came to [facility] by himself.  He was seen by this clinician and NP [name].  [name] did the MSE.  Pt does meet criteria for continuous observation at [facility].  Pt is voluntary and is agreeable to this.  Patient has a depressed affect.  He has good eye contact and is oriented x4.  Pt is not responding to internal stimuli.  He is paranoid, thinking his phone is being tapped.  Pt reports poor sleep and appetite.  He says he has a headache that is hard to get rid of.    Pt has an on-line therapist that he just started with last week.  He has never had inpatient care befrore.    Chief Complaint: No chief complaint on file.  Visit Diagnosis: MDD recurrent, severe    CCA Screening, Triage and Referral (STR)  Patient Reported Information How did you hear about [place]? Self  What Is the Reason for Your Visit/Call Today? Pt says that he has been having both SI and HI last night.  Only got 45 minutes of sleep then he went to work.  Pt says he is going through a divorce which will be finalized on 05/15.  He thinks that his soon-to-be ex is sleeping with his "former" best friend.  He has been having some thoughts of harming the friend.  He does not wish to harm his ex-wife.  He has no plan or intention of harming this person though.  Pt said he has not seen his ex-wife in two years and they hav eno children that he is aware of.  He had a drieam that they had a daughter and it has been worrying him about whether it could be true.  Pt has had some SI earlier this morning.  He has no plan.  He did start taking notes "in my mind" of what I would tell people before he dies.  Internal voice telling him to kill himself.  NO visual hallucinations.  Pt also says he has been having migrains since around March 3rd or so.  He takes ibuprophen and ASA but that only provides temorary relief.  Pt Pt has loss of appetite when the  headache is around.  Pt has been getting <4H/D of sleep over the last two months.  P does not have access to guns but knives.  No hx of self harm.  Pt last had serious SI about 12 years ago.  Pt has a thrapst named [name] through on-line telehealth.  Pt has an appt coming up on 05/11.  Doing once per week therapy due to increased anxiety.  Pt used marijuana in the past but the last time he used any was November of '22.  Pt has no previous inpatient psychiatric history.Pt feels that his phone may be tapped and he acknowledges that he has paranoia.  He says that he will only recall half of a conversation and fixate on what is said.  How Long Has This Been Causing You Problems? > than 6 months  What Do You Feel Would Help You the Most Today? Treatment for Depression or other mood problem   Have You Recently Had Any Thoughts About Hurting Yourself? Yes  Are You Planning to Commit Suicide/Harm Yourself At This time? No   Have you Recently Had Thoughts About Hurting Someone [name]? No  Are You Planning to Harm Someone at This Time? No  Explanation: No data recorded  Have You Used Any Alcohol or Drugs in the Past 24 Hours? No  How Long Ago Did You Use Drugs or Alcohol? No data recorded What Did You Use and How Much? No data recorded  Do You Currently Have a Therapist/Psychiatrist? Yes  Name of Therapist/Psychiatrist: therapist [name] online telehealth   Have You Been Recently Discharged From Any Office Practice or Programs? No  Explanation of Discharge From Practice/Program: No data recorded    CCA Screening Triage Referral Assessment Type of Contact: Face-to-Face  Telemedicine Service Delivery:   Is this Initial or Reassessment? No data recorded Date Telepsych consult ordered in [facility]:  No data recorded Time Telepsych consult ordered in [facility]:  No data recorded Location of Assessment: [facility]  Provider Location: [facility]   Collateral  Involvement: No data recorded  Does Patient Have a Court Appointed Legal Guardian? No data recorded Name and Contact of Legal Guardian: No data recorded If Minor and Not Living with Parent(s), Who has Custody? No data recorded Is CPS involved or ever been involved? Never  Is APS involved or ever been involved? Never   Patient Determined To Be At Risk for Harm To Self or Others Based on Review of Patient Reported Information or Presenting Complaint? Yes, for Self-Harm  Method: No data recorded Availability of Means: No data recorded Intent: No data recorded Notification Required: No data recorded Additional Information for Danger to Others Potential: No data recorded Additional Comments for Danger to Others Potential: No data recorded Are There Guns or Other Weapons in Your Home? No data recorded Types of Guns/Weapons: No data recorded Are These Weapons Safely Secured?                            No data recorded Who Could Verify You Are Able To Have These Secured: No data recorded Do You Have any Outstanding Charges, Pending Court Dates, Parole/Probation? No data recorded Contacted To Inform of Risk of Harm To Self or Others: No data recorded   Does Patient Present under Involuntary Commitment? No  IVC Papers Initial File Date: No data recorded  [place] of Residence: [place]   Patient Currently Receiving the Following Services: Individual Therapy   Determination of Need: Urgent (48 hours)   Options For Referral: [facility] [name], FNB recommends [facility].)     CCA Biopsychosocial Patient Reported Schizophrenia/Schizoaffective Diagnosis in Past: No   Strengths: Pt likes to cook, play basketball and loves kids.   Mental Health Symptoms Depression:   Change in energy/activity; Fatigue; Sleep (too much or little); Increase/decrease in appetite; Hopelessness   Duration of Depressive symptoms:  Duration of Depressive Symptoms: Greater than two weeks   Mania:    None   Anxiety:    Difficulty concentrating; Worrying; Tension; Sleep; Restlessness   Psychosis:   Hallucinations   Duration of Psychotic symptoms:  Duration of Psychotic Symptoms: N/A   Trauma:   Difficulty staying/falling asleep; Guilt/shame   Obsessions:   Good insight   Compulsions:   Good insight; Repeated behaviors/mental acts   Inattention:   Disorganized   Hyperactivity/Impulsivity:   None   Oppositional/Defiant Behaviors:   None   Emotional Irregularity:   Chronic feelings of emptiness   Other Mood/Personality Symptoms:  No data recorded   Mental Status Exam Appearance and self-care  Stature:   Tall   Weight:   Average weight   Clothing:   Casual   Grooming:  Normal   Cosmetic use:   None   Posture/gait:   Normal   Motor activity:  No data recorded  Sensorium  Attention:   Normal   Concentration:   Anxiety interferes   Orientation:   X5   Recall/memory:   Normal   Affect and Mood  Affect:   Depressed   Mood:   Depressed   Relating  Eye contact:   Normal   Facial expression:   Depressed   Attitude toward examiner:   Cooperative   Thought and Language  Speech flow:  Clear and Coherent   Thought content:   Appropriate to Mood and Circumstances   Preoccupation:   Ruminations   Hallucinations:   Auditory (Internal voice)   Organization:  No data recorded  [employer] of Knowledge:  No data recorded  Intelligence:  No data recorded  Abstraction:  No data recorded  Judgement:  No data recorded  Reality Testing:  No data recorded  Insight:  No data recorded  Decision Making:  No data recorded  Social Functioning  Social Maturity:  No data recorded  Social Judgement:   Normal   Stress  Stressors:   Relationship; Grief/losses   Coping Ability:   Overwhelmed; Exhausted   Skill Deficits:   Decision making   Supports:   Family; Friends/Service system     Religion:     Leisure/Recreation:    Exercise/Diet:     CCA Employment/Education Employment/Work Situation:    Education:     CCA Family/Childhood History Family and Relationship History:    Childhood History:     Child/Adolescent Assessment:     CCA Substance Use Alcohol/Drug Use:                           ASAM's:  Six Dimensions of Multidimensional Assessment  Dimension 1:  Acute Intoxication and/or Withdrawal Potential:      Dimension 2:  Biomedical Conditions and Complications:      Dimension 3:  Emotional, Behavioral, or Cognitive Conditions and Complications:     Dimension 4:  Readiness to Change:     Dimension 5:  Relapse, Continued use, or Continued Problem Potential:     Dimension 6:  Recovery/Living Environment:     ASAM Severity Score:    ASAM Recommended Level of Treatment:     Substance use Disorder (SUD)    Recommendations for Services/Supports/Treatments:    Discharge Disposition:    DSM5 Diagnoses: There are no problems to display for this patient.    Referrals to Alternative Service(s): Referred to Alternative Service(s):   Place:   Date:   Time:    Referred to Alternative Service(s):   Place:   Date:   Time:    Referred to Alternative Service(s):   Place:   Date:   Time:    Referred to Alternative Service(s):   Place:   Date:   Time:     Wandra Mannan

## 2021-05-11 NOTE — ED Notes (Signed)
Pt ate breakfast this morning.  Complaint of lower back pain 4/10, prn med provided.  Pt denies SI, HI, and AVH. Reports he slept "alright" over night.  Pt reports he is ready to get back to his day to day.  Minimal eye contact when speaking with this Clinical research associate.  Breathing is even and unlabored.  Will continue to monitor for safety.  ?

## 2021-05-11 NOTE — ED Notes (Signed)
Pt. Refused vitals.

## 2021-05-11 NOTE — ED Provider Notes (Signed)
FBC/OBS ASAP Discharge Summary ? ?Date and Time: 05/11/2021 1:03 PM  ?Name: Sean King  ?MRN:  [id number]  ? ?Discharge Diagnoses:  ?Final diagnoses:  ?Auditory hallucination  ?Homicidal thoughts  ?Suicidal ideation  ?Depression, unspecified depression type  ? ?Subjective:  ? ?Pt assessed by nurse practitioner today.  ? ?Pt w/ reported hx of PTSD, anxiety. ? ?Pt reports he came in last night due to "ideations" from the night before. Reports stressor of upcoming divorce court date of 05/17/21. States he was experiencing SI, was "writing goodbye letters in my head". Denies plan or intent to act on plan. States he was also experiencing HI towards his friend, as he believed his friend was sleeping w/ his "soon to be ex-wife". Denies plan or intent to act on plan.  ? ?Initially reports AH of voice telling him "I should just kill myself", although later reports experiencing voice as an intrusive thought rather than AH. Also reports experiencing intrusive thoughts that his phone is tapped. States he knows thoughts "doesn't make sense", although does feel anxiety. States when experiencing these thoughts, will try not to avoid using his phone, will contact his support and use phone.  ? ?Pt reports current euthymic, "ok" mood. Denies current SI/VI/HI. Reports he is "accepting now" and that he wants to "keep my happiness".  ? ?Denies hx of NSSI. Denies hx of SA. Denies hx of inpatient psychiatric hospitalization. Is currently connected w/ [name] for therapy, has had first session last week, next session is on Thursday at [facility]. Is not currently connected w/ medication management.  ? ?Denies regular use of alcohol. Reports last use of alcohol 2 weeks ago, had "1 shot". Denies use of nicotine products. Reports stopped using MJA on 02/28/21. Denies use of crack/cocaine, methamphetamine, other SU.  ? ?Pt is currently living alone.  ? ?Denies access to a firearm. ? ?Pt verbally contracts to safety. Reviewed sxs of worsening  condition, including not checking in with his support system, being "stuck in house", "not eating". Reviewed coping skills, including, walking, "hitting the gym", gardening, cooking. Reports support system includes Rica Records (his brother), Alto Denver (his sister), and his friends.  ? ?Pt gave verbal consent to obtain collateral from his sister, Alto Denver, and provided NP w/ her phone number, [phone] (cell phone), [phone] (work). NP reached [facility] at [phone]. Alto Denver denies safety concerns w/ discharge. Confirms she is a support for pt and states she can be close support for/monitor pt upon discharge.  ? ?Reviewed abnml lab results w/ pt, and discussed follow up w/ PCP. Pt verbalized understanding. ? ?Discussed plan for discharge w/ resources for PCP and outpatient medication management and counseling. Pt agrees w/ plan. Pt verbalizes he feels ready to go home.  ? ?Stay Summary:  ?Pt is a 36 y/o male w/ reported hx of ptsd, anxiety, presenting to [facility] on 05/10/21 for SI and HI. On reassessment by NP today, pt denies SI/VI/HI. Collateral obtained w/ pt's sister, who denies safety concerns w/ discharge. Pt discharged w/ recommendations for follow up w/ PCP and outpatient medication management and counseling. Pt agrees w/ plan. ? ?Total Time spent with patient: 20 minutes ? ?Past Psychiatric History: Reported hx of ptsd, anxiety ?Past Medical History:  ?Past Medical History:  ?Diagnosis Date  ? PTSD (post-traumatic stress disorder)   ? Scoliosis   ? No past surgical history on file. ?Family History: No family history on file. ?Family Psychiatric History: Unknown ?Social History:  ?Social History  ? ?Substance and Sexual Activity  ?Alcohol  Use Yes  ? Comment: sparingly  ?   ?Social History  ? ?Substance and Sexual Activity  ?Drug Use No  ?  ?Social History  ? ?Socioeconomic History  ? Marital status: Legally Separated  ?  Spouse name: Not on file  ? Number of children: Not on file  ? Years of  education: Not on file  ? Highest education level: Not on file  ?Occupational History  ? Not on file  ?Tobacco Use  ? Smoking status: Never  ? Smokeless tobacco: Never  ?Vaping Use  ? Vaping Use: Never used  ?Substance and Sexual Activity  ? Alcohol use: Yes  ?  Comment: sparingly  ? Drug use: No  ? Sexual activity: Yes  ?  Partners: Female  ?  Birth control/protection: Condom  ?Other Topics Concern  ? Not on file  ?Social History Narrative  ? Not on file  ? ?Social Determinants of Health  ? ?Financial Resource Strain: Not on file  ?Food Insecurity: Not on file  ?Transportation Needs: Not on file  ?Physical Activity: Not on file  ?Stress: Not on file  ?Social Connections: Not on file  ? ?SDOH:  ?SDOH Screenings  ? ?Alcohol Screen: Not on file  ?Depression (PHQ2-9): Not on file  ?Financial Resource Strain: Not on file  ?Food Insecurity: Not on file  ?Housing: Not on file  ?Physical Activity: Not on file  ?Social Connections: Not on file  ?Stress: Not on file  ?Tobacco Use: Not on file  ?Transportation Needs: Not on file  ? ? ?Tobacco Cessation:  N/A, patient does not currently use tobacco products ? ?Current Medications:  ?Current Facility-Administered Medications  ?Medication Dose Route Frequency Provider Last Rate Last Admin  ? acetaminophen (TYLENOL) tablet 650 mg  650 mg Oral Q6H PRN Sindy GuadeloupeWilliams, Roy, NP   650 mg at 05/11/21 0825  ? alum & mag hydroxide-simeth (MAALOX/MYLANTA) 200-200-20 MG/5ML suspension 30 mL  30 mL Oral Q4H PRN Sindy GuadeloupeWilliams, Roy, NP      ? hydrOXYzine (ATARAX) tablet 25 mg  25 mg Oral TID PRN Sindy GuadeloupeWilliams, Roy, NP   25 mg at 05/11/21 [id number]  ? magnesium hydroxide (MILK OF MAGNESIA) suspension 30 mL  30 mL Oral Daily PRN Sindy GuadeloupeWilliams, Roy, NP      ? traZODone (DESYREL) tablet 50 mg  50 mg Oral QHS PRN Sindy GuadeloupeWilliams, Roy, NP      ? ?No current outpatient medications on file.  ? ? ?PTA Medications: (Not in a hospital admission) ? ? ?Musculoskeletal  ?Strength & Muscle Tone: within normal limits ?Gait & Station:  normal ?Patient leans: N/A ? ?Psychiatric Specialty Exam  ?Presentation  ?General Appearance: Appropriate for Environment ? ?Eye Contact:Good ? ?Speech:Clear and Coherent; Normal Rate ? ?Speech Volume:Normal ? ?Handedness: ? ?Mood and Affect  ?Mood:Euthymic ? ?Affect:Blunt ? ?Thought Process  ?Thought Processes:Coherent; Goal Directed; Linear ? ?Descriptions of Associations:Intact ? ?Orientation:Full (Time, Place and Person) ? ?Thought Content:Logical ? Diagnosis of Schizophrenia or Schizoaffective disorder in past: No ?  ?Hallucinations:Hallucinations: None ? ?Ideas of Reference:None ? ?Suicidal Thoughts:Suicidal Thoughts: No ? ?Homicidal Thoughts:Homicidal Thoughts: No ? ?Sensorium  ?Memory:Immediate Good ? ?Judgment:Good ? ?Insight:Good ? ? ?Executive Functions  ?Concentration:Good ? ?Attention Span:Good ? ?Recall:Good ? ?Fund of Knowledge:Good ? ?Language:Good ? ? ?Psychomotor Activity  ?Psychomotor Activity:Psychomotor Activity: Normal ? ? ?Assets  ?Assets:Communication Skills; Desire for Improvement; Housing; Social Support ? ? ?Sleep  ?Sleep:Sleep: Poor ?Number of Hours of Sleep: 4 ? ? ?Nutritional Assessment (For OBS and FBC admissions only) ?Has the  patient had a decrease in food intake/or appetite?: No ?Does the patient have dental problems?: No ?Does the patient have eating habits or behaviors that may be indicators of an eating disorder including binging or inducing vomiting?: No ?Has the patient recently lost weight without trying?: 0 ?Has the patient been eating poorly because of a decreased appetite?: 0 ?Malnutrition Screening Tool Score: 0 ? ? ? ?Physical Exam  ?Physical Exam ?Cardiovascular:  ?   Rate and Rhythm: Normal rate.  ?Pulmonary:  ?   Effort: Pulmonary effort is normal.  ?Neurological:  ?   Mental Status: He is alert and oriented to person, place, and time.  ?Psychiatric:     ?   Attention and Perception: Attention and perception normal.     ?   Mood and Affect: Mood normal. Affect is  blunt.     ?   Speech: Speech normal.     ?   Behavior: Behavior normal. Behavior is cooperative.     ?   Thought Content: Thought content normal.     ?   Cognition and Memory: Cognition and memory normal.     ?   Judgment: Rocky Crafts
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
## 2021-05-11 NOTE — ED Notes (Signed)
Safe transport called to take pt to [facility] where vehicle is located for discharge.Pt discharged with  AVS.  AVS reviewed prior to discharge.  Pt alert, oriented, and ambulatory.  Safety maintained.  ?

## 2021-05-11 NOTE — Discharge Instructions (Addendum)
For your primary care needs: ?[facility] ?[address]  ?[place], [place] [address] ?[phone] ? ?Please come to [facility] (this facility, SECOND floor) during walk in hours for appointment with psychiatrist/provider for further medication management and for therapists for therapy.  ? ?Walk-Ins for medication management  are available on Monday, Wednesday, Thursday and Friday from 8am-11am.  It is first come, first -serve; it is best to arrive by 7:00 AM.  ? ?Walk-Ins for therapy are available on Monday and Wednesday?s  8am-11am.  It is first come, first -serve; it is best to arrive by 7:00 AM.  ? ?When you arrive please go upstairs for your appointment. If you are unsure of where to go, inform the front desk that you are here for a walk in appointment and they will assist you with directions upstairs. ? ?Address:  ?[address], in [place], [address] ?Ph: [phone]  ?

## 2021-05-13 ENCOUNTER — Encounter (HOSPITAL_COMMUNITY): Payer: Self-pay | Admitting: Physician Assistant

## 2021-05-13 ENCOUNTER — Ambulatory Visit (INDEPENDENT_AMBULATORY_CARE_PROVIDER_SITE_OTHER): Payer: No Payment, Other | Admitting: Physician Assistant

## 2021-05-13 VITALS — BP 130/85 | HR 78 | Ht 72.0 in | Wt 198.0 lb

## 2021-05-13 DIAGNOSIS — G479 Sleep disorder, unspecified: Secondary | ICD-10-CM | POA: Diagnosis not present

## 2021-05-13 DIAGNOSIS — F4323 Adjustment disorder with mixed anxiety and depressed mood: Secondary | ICD-10-CM | POA: Diagnosis not present

## 2021-05-13 DIAGNOSIS — F4312 Post-traumatic stress disorder, chronic: Secondary | ICD-10-CM

## 2021-05-13 MED ORDER — TRAZODONE HCL 50 MG PO TABS: 50.0000 mg | ORAL_TABLET | Freq: Every day | ORAL | 1 refills | Status: DC

## 2021-05-13 MED ORDER — HYDROXYZINE HCL 25 MG PO TABS: 25.0000 mg | ORAL_TABLET | Freq: Three times a day (TID) | ORAL | 1 refills | Status: DC | PRN
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
## 2021-05-16 ENCOUNTER — Encounter (HOSPITAL_COMMUNITY): Payer: Self-pay | Admitting: Physician Assistant

## 2021-05-31 ENCOUNTER — Telehealth (HOSPITAL_COMMUNITY): Payer: Self-pay

## 2021-05-31 NOTE — BH Assessment (Signed)
Care Management - [place] Follow Up Discharges   Writer attempted to make contact with patient today and was unsuccessful.  Writer left a HIPPA compliant voice message.   Per chart review, pt will follow up with establishe therapist Sharlett Iles on 05-13-2021 and Trinna Post on 06-17-21.

## 2021-06-10 ENCOUNTER — Encounter: Payer: Self-pay | Admitting: Nurse Practitioner

## 2021-06-10 ENCOUNTER — Ambulatory Visit: Payer: Self-pay | Admitting: Nurse Practitioner

## 2021-06-10 DIAGNOSIS — Z113 Encounter for screening for infections with a predominantly sexual mode of transmission: Secondary | ICD-10-CM

## 2021-06-10 LAB — HM HIV SCREENING LAB: HM HIV Screening: NEGATIVE

## 2021-06-10 LAB — GRAM STAIN

## 2021-06-10 LAB — HM HEPATITIS C SCREENING LAB: HM Hepatitis Screen: NEGATIVE

## 2021-06-10 LAB — HEPATITIS B SURFACE ANTIGEN

## 2021-06-10 NOTE — Progress Notes (Signed)
Pt here for STD screening.  Gram stain results reviewed, no treatment required per SO.  Kelten Enochs M Rasmus Preusser, RN ° °

## 2021-06-10 NOTE — Progress Notes (Signed)
[facility] Department STI clinic/screening visit  Subjective:  Sean King is a 36 y.o. male being seen today for an STI screening visit. The patient reports they do not have symptoms.    Patient has the following medical conditions:   Patient Active Problem List   Diagnosis Date Noted   Chronic post-traumatic stress disorder (PTSD) 05/13/2021   Adjustment disorder with mixed anxiety and depressed mood 05/13/2021   Sleep disturbance 05/13/2021     Chief Complaint  Patient presents with   SEXUALLY TRANSMITTED DISEASE    STI screening. Denies sx    HPI  Patient reports Patient reports to clinic for a STD screening.  Patient is asymptomatic.  Does the patient or their partner desires a pregnancy in the next year? No  Screening for MPX risk: Does the patient have an unexplained rash? No Is the patient MSM? No Does the patient endorse multiple sex partners or anonymous sex partners? No Did the patient have close or sexual contact with a person diagnosed with MPX? No Has the patient traveled outside the [place] where MPX is endemic? No Is there a high clinical suspicion for MPX-- evidenced by one of the following No  -Unlikely to be chickenpox  -Lymphadenopathy  -Rash that present in same phase of evolution on any given body part   See flowsheet for further details and programmatic requirements.    There is no immunization history on file for this patient.   The following portions of the patient's history were reviewed and updated as appropriate: allergies, current medications, past medical history, past social history, past surgical history and problem list.  Objective:  There were no vitals filed for this visit.  Physical Exam Constitutional:      Appearance: Normal appearance.  HENT:     Head: Normocephalic.     Right Ear: External ear normal.     Left Ear: External ear normal.     Nose: Nose normal.     Mouth/Throat:     Lips: Pink.     Mouth: Mucous  membranes are moist.     Comments: No visible dental caries  Pulmonary:     Effort: Pulmonary effort is normal.  Abdominal:     General: Abdomen is flat.     Palpations: Abdomen is soft.  Genitourinary:    Penis: Circumcised.      Comments: Pubic area without nits, lice, hair loss, edema, erythema, lesions and inguinal adenopathy. Penis  without rash, lesions and discharge at meatus. Testicles descended bilaterally,nt, no masses or edema.  Musculoskeletal:     Cervical back: Full passive range of motion without pain, normal range of motion and neck supple.  Skin:    General: Skin is warm and dry.  Neurological:     Mental Status: He is alert and oriented to person, place, and time.  Psychiatric:        Attention and Perception: Attention normal.        Mood and Affect: Mood normal.        Speech: Speech normal.        Behavior: Behavior normal. Behavior is cooperative.       Assessment and Plan:  Sean King is a 36 y.o. male presenting to the [facility] Department for STI screening  1. Screening examination for venereal disease -36 year old male in clinic for STD screening. -Patient does not have STI symptoms Patient accepted all screenings including gram stain, urethra GC, urine and bloodwork for HIV/RPR.  Patient meets criteria for HepB screening? Yes. Ordered? Yes Patient meets criteria for HepC screening? Yes. Ordered? Yes Recommended condom use with all sex Discussed importance of condom use for STI prevent  Treat gram stain per standing order Discussed time line for State Lab results and that patient will be called with positive results and encouraged patient to call if he had not heard in 2 weeks Recommended returning for continued or worsening symptoms.    - HIV/HCV  Lab - Syphilis Serology,  Lab - HBV Antigen/Antibody State Lab - Gonococcus culture - Gram stain     Return if symptoms worsen or fail to improve.  Future  Appointments  Date Time Provider Department Center  06/17/2021  3:00 PM N[place], Tommas Olp, PA [facility] None    [place] Fellows, [place]

## 2021-06-14 LAB — GONOCOCCUS CULTURE

## 2021-06-15 ENCOUNTER — Ambulatory Visit: Payer: Self-pay

## 2021-06-17 ENCOUNTER — Telehealth (INDEPENDENT_AMBULATORY_CARE_PROVIDER_SITE_OTHER): Payer: No Payment, Other | Admitting: Physician Assistant

## 2021-06-17 DIAGNOSIS — F4312 Post-traumatic stress disorder, chronic: Secondary | ICD-10-CM

## 2021-06-17 DIAGNOSIS — F4323 Adjustment disorder with mixed anxiety and depressed mood: Secondary | ICD-10-CM

## 2021-06-17 DIAGNOSIS — G479 Sleep disorder, unspecified: Secondary | ICD-10-CM

## 2021-06-20 ENCOUNTER — Encounter (HOSPITAL_COMMUNITY): Payer: Self-pay | Admitting: Physician Assistant

## 2021-06-20 NOTE — Progress Notes (Cosign Needed)
BH MD/PA/NP OP Progress Note  Virtual Visit via Video Note  I connected with Sean King on 06/20/21 at  3:00 PM EDT by a video enabled telemedicine application and verified that I am speaking with the correct person using two identifiers.  Location: Patient: Home Provider: Clinic   I discussed the limitations of evaluation and management by telemedicine and the availability of in person appointments. The patient expressed understanding and agreed to proceed.  Follow Up Instructions:   I discussed the assessment and treatment plan with the patient. The patient was provided an opportunity to ask questions and all were answered. The patient agreed with the plan and demonstrated an understanding of the instructions.   The patient was advised to call back or seek an in-person evaluation if the symptoms worsen or if the condition fails to improve as anticipated.  I provided 29 minutes of non-face-to-face time during this encounter.  Meta Hatchet, PA   06/20/2021 8:14 PM Sean King  MRN:  [id number]  Chief Complaint:  Chief Complaint  Patient presents with   Follow-up   HPI:   Sean King  Visit Diagnosis:    ICD-10-CM   1. Chronic post-traumatic stress disorder (PTSD)  F43.12     2. Adjustment disorder with mixed anxiety and depressed mood  F43.23     3. Sleep disturbance  G47.9       Past Psychiatric History:  Sleep disturbances Complex-PTSD Adjustment disorder with mixed anxiety and depression  Past Medical History:  Past Medical History:  Diagnosis Date   PTSD (post-traumatic stress disorder)    Scoliosis    History reviewed. No pertinent surgical history.  Family Psychiatric History:  Father - schizophrenia Mother - Obsessive compulsive disorder Brother - Borderline personality disorder  Family History: History reviewed. No pertinent family history.  Social History:  Social History   Socioeconomic History   Marital status: Divorced     Spouse name: Not on file   Number of children: Not on file   Years of education: Not on file   Highest education level: Not on file  Occupational History   Not on file  Tobacco Use   Smoking status: Never   Smokeless tobacco: Never  Vaping Use   Vaping Use: Never used  Substance and Sexual Activity   Alcohol use: Yes    Alcohol/week: 2.0 standard drinks of alcohol    Types: 2 Shots of liquor per week    Comment: twice a month   Drug use: Not Currently    Types: Marijuana    Comment: 02/2021   Sexual activity: Yes    Partners: Female    Birth control/protection: Condom  Other Topics Concern   Not on file  Social History Narrative   Not on file   Social Determinants of Health   Financial Resource Strain: Not on file  Food Insecurity: Not on file  Transportation Needs: Not on file  Physical Activity: Not on file  Stress: Not on file  Social Connections: Not on file    Allergies:  Allergies  Allergen Reactions   Coconut (Cocos Nucifera) Hives   Hydrocortisone Hives   Neosporin [Neomycin-Bacitracin Zn-Polymyx] Rash    Metabolic Disorder Labs: Lab Results  Component Value Date   HGBA1C 5.4 05/10/2021   MPG 108.28 05/10/2021   No results found for: "PROLACTIN" No results found for: "CHOL", "TRIG", "HDL", "CHOLHDL", "VLDL", "LDLCALC" Lab Results  Component Value Date   TSH 1.361 05/10/2021    Therapeutic Level Labs: No results  found for: "LITHIUM" No results found for: "VALPROATE" No results found for: "CBMZ"  Current Medications: Current Outpatient Medications  Medication Sig Dispense Refill   hydrOXYzine (ATARAX) 25 MG tablet Take 1 tablet (25 mg total) by mouth 3 (three) times daily as needed. 90 tablet 1   traZODone (DESYREL) 50 MG tablet Take 1 tablet (50 mg total) by mouth at bedtime. 30 tablet 1   No current facility-administered medications for this visit.     Musculoskeletal: Strength & Muscle Tone: within normal limits Gait & Station:  normal Patient leans: N/A  Psychiatric Specialty Exam: Review of Systems  Psychiatric/Behavioral:  Positive for sleep disturbance. Negative for decreased concentration, dysphoric mood, hallucinations, self-injury and suicidal ideas. The patient is nervous/anxious. The patient is not hyperactive.     There were no vitals taken for this visit.There is no height or weight on file to calculate BMI.  General Appearance: Casual  Eye Contact:  Good  Speech:  Clear and Coherent and Normal Rate  Volume:  Normal  Mood:  Anxious and Depressed  Affect:  Congruent and Depressed  Thought Process:  Coherent and Descriptions of Associations: Intact  Orientation:  Full (Time, Place, and Person)  Thought Content: WDL   Suicidal Thoughts:  No  Homicidal Thoughts:  No  Memory:  Immediate;   Good Recent;   Good Remote;   Good  Judgement:  Good  Insight:  Good  Psychomotor Activity:  Normal  Concentration:  Concentration: Good and Attention Span: Good  Recall:  Good  Fund of Knowledge: Good  Language: Good  Akathisia:  No  Handed:  Left  AIMS (if indicated): not done  Assets:  Communication Skills Desire for Improvement Financial Resources/Insurance Housing Social Support Talents/Skills Transportation Vocational/Educational  ADL's:  Intact  Cognition: WNL  Sleep:  Fair   Screenings: GAD-7    Flowsheet Row Video Visit from 06/17/2021 in [facility] Office Visit from 05/13/2021 in [facility]  Total GAD-7 Score 12 15      PHQ2-9    Flowsheet Row Video Visit from 06/17/2021 in [facility] Office Visit from 05/13/2021 in [facility]  PHQ-2 Total Score 4 4  PHQ-9 Total Score 14 19      Flowsheet Row Video Visit from 06/17/2021 in [facility] Office Visit from 05/13/2021 in [facility]  C-SSRS RISK CATEGORY Low Risk  High Risk        Assessment and Plan:     Collaboration of Care: Collaboration of Care: Psychiatrist AEB patient being followed by a mental health provider and Referral or follow-up with counselor/therapist AEB patient requesting to be placed with a licensed clinical social worker at this facility  Patient/Guardian was advised Release of Information must be obtained prior to any record release in order to collaborate their care with an outside provider. Patient/Guardian was advised if they have not already done so to contact the registration department to sign all necessary forms in order for [place] to release information regarding their care.   Consent: Patient/Guardian gives verbal consent for treatment and assignment of benefits for services provided during this visit. Patient/Guardian expressed understanding and agreed to proceed.   1. Chronic post-traumatic stress disorder (PTSD)   2. Adjustment disorder with mixed anxiety and depressed mood   3. Sleep disturbance  Patient to follow up in 2 months Provider spent a total of 29 minutes with the patient/reviewing the patient's  chart  Meta Hatchet, PA 06/20/2021, 8:14 PM

## 2021-08-14 ENCOUNTER — Other Ambulatory Visit: Payer: Self-pay

## 2021-08-14 ENCOUNTER — Encounter (HOSPITAL_COMMUNITY): Payer: Self-pay | Admitting: *Deleted

## 2021-08-14 ENCOUNTER — Emergency Department (HOSPITAL_COMMUNITY): Payer: Self-pay

## 2021-08-14 ENCOUNTER — Emergency Department (HOSPITAL_COMMUNITY)
Admission: EM | Admit: 2021-08-14 | Discharge: 2021-08-15 | Discharge status: Against Medical Advice | Payer: Self-pay | Attending: Emergency Medicine | Admitting: Emergency Medicine

## 2021-08-14 DIAGNOSIS — Z5321 Procedure and treatment not carried out due to patient leaving prior to being seen by health care provider: Secondary | ICD-10-CM | POA: Insufficient documentation

## 2021-08-14 DIAGNOSIS — R110 Nausea: Secondary | ICD-10-CM | POA: Insufficient documentation

## 2021-08-14 DIAGNOSIS — H53149 Visual discomfort, unspecified: Secondary | ICD-10-CM | POA: Insufficient documentation

## 2021-08-14 DIAGNOSIS — R519 Headache, unspecified: Secondary | ICD-10-CM | POA: Insufficient documentation

## 2021-08-14 HISTORY — DX: Migraine, unspecified, not intractable, without status migrainosus: G43.909

## 2021-08-14 LAB — CBC WITH DIFFERENTIAL/PLATELET
Abs Immature Granulocytes: 0.02 10*3/uL (ref 0.00–0.07)
Basophils Absolute: 0 10*3/uL (ref 0.0–0.1)
Basophils Relative: 1 %
Eosinophils Absolute: 0.1 10*3/uL (ref 0.0–0.5)
Eosinophils Relative: 1 %
HCT: 43.5 % (ref 39.0–52.0)
Hemoglobin: 13.9 g/dL (ref 13.0–17.0)
Immature Granulocytes: 0 %
Lymphocytes Relative: 6 %
Lymphs Abs: 0.4 10*3/uL — ABNORMAL LOW (ref 0.7–4.0)
MCH: 24.1 pg — ABNORMAL LOW (ref 26.0–34.0)
MCHC: 32 g/dL (ref 30.0–36.0)
MCV: 75.4 fL — ABNORMAL LOW (ref 80.0–100.0)
Monocytes Absolute: 0.7 10*3/uL (ref 0.1–1.0)
Monocytes Relative: 11 %
Neutro Abs: 5 10*3/uL (ref 1.7–7.7)
Neutrophils Relative %: 81 %
Platelets: 177 10*3/uL (ref 150–400)
RBC: 5.77 MIL/uL (ref 4.22–5.81)
RDW: 13.4 % (ref 11.5–15.5)
WBC: 6.2 10*3/uL (ref 4.0–10.5)
nRBC: 0 % (ref 0.0–0.2)

## 2021-08-14 LAB — URINALYSIS, ROUTINE W REFLEX MICROSCOPIC
Bilirubin Urine: NEGATIVE
Glucose, UA: NEGATIVE mg/dL
Hgb urine dipstick: NEGATIVE
Ketones, ur: NEGATIVE mg/dL
Leukocytes,Ua: NEGATIVE
Nitrite: NEGATIVE
Protein, ur: NEGATIVE mg/dL
Specific Gravity, Urine: 1.024 (ref 1.005–1.030)
pH: 7 (ref 5.0–8.0)

## 2021-08-14 LAB — COMPREHENSIVE METABOLIC PANEL
ALT: 21 U/L (ref 0–44)
AST: 23 U/L (ref 15–41)
Albumin: 4.2 g/dL (ref 3.5–5.0)
Alkaline Phosphatase: 68 U/L (ref 38–126)
Anion gap: 9 (ref 5–15)
BUN: 15 mg/dL (ref 6–20)
CO2: 25 mmol/L (ref 22–32)
Calcium: 9.4 mg/dL (ref 8.9–10.3)
Chloride: 105 mmol/L (ref 98–111)
Creatinine, Ser: 1.36 mg/dL — ABNORMAL HIGH (ref 0.61–1.24)
GFR, Estimated: >60 mL/min (ref >60)
Glucose, Bld: 99 mg/dL (ref 70–99)
Potassium: 3.8 mmol/L (ref 3.5–5.1)
Sodium: 139 mmol/L (ref 135–145)
Total Bilirubin: 0.7 mg/dL (ref 0.3–1.2)
Total Protein: 7.6 g/dL (ref 6.5–8.1)

## 2021-08-14 LAB — PROTIME-INR
INR: 1.1 (ref 0.8–1.2)
Prothrombin Time: 14.3 seconds (ref 11.4–15.2)

## 2021-08-14 LAB — LACTIC ACID, PLASMA: Lactic Acid, Venous: 1.6 mmol/L (ref 0.5–1.9)

## 2021-08-14 MED ORDER — ACETAMINOPHEN 325 MG PO TABS
650.0000 mg | ORAL_TABLET | Freq: Once | ORAL | Status: AC
  Administered 2021-08-14: 650 mg via ORAL
  Filled 2021-08-14: qty 2

## 2021-08-14 NOTE — ED Notes (Signed)
PT refused CT @ 2050

## 2021-08-14 NOTE — ED Triage Notes (Addendum)
Pt states frontal migraine headache since last night. Denies any other s/s.  No blurred vision, sore throat, nausea.  PT does have a temp of 101.4 oral.

## 2021-08-14 NOTE — ED Provider Triage Note (Signed)
Emergency Medicine Provider Triage Evaluation Note  Sean King , a 36 y.o. male  was evaluated in triage.  Pt complains of bandlike headache of the forehead.  Started last night, no preceding events or recent trauma.  Endorses photophobia and some mild nausea.  Denies vomiting.  Measured temperature of 101.4 in the ED, patient was unaware he had a fever.  Denies vision changes, numbness or tingling of extremities, facial asymmetry, or neck stiffness/pain.  Hx of migraines, states this is somewhat similar to his normal.  Overall feels unwell.  Denies recent upper respiratory symptoms, sore throat, or cough.  States "my left knee feels weird".  Review of Systems  Positive:  Negative: See above  Physical Exam  BP (!) 141/83   Pulse (!) 103   Temp (!) 101.4 F (38.6 C) (Oral)   Resp 16   Ht 6' (1.829 m)   Wt 89.8 kg   SpO2 99%   BMI 26.85 kg/m  Gen:   Awake, appears unwell Resp:  Normal effort  MSK:   Moves extremities without difficulty  Other:  Neck supple on exam.  No meningismus.  Lower extremities number extremities pain neurovascularly intact.  Gaze aligned appropriately.  No truncal deviation.  Gait and coordination appear grossly intact.  No facial asymmetry.  No significant erythema, warmth, reduced ROM, swelling, or tenderness of the left knee appreciated.  Medical Decision Making  Medically screening exam initiated at 7:54 PM.  Appropriate orders placed.  Sean King was informed that the remainder of the evaluation will be completed by another provider, this initial triage assessment does not replace that evaluation, and the importance of remaining in the ED until their evaluation is complete.  Patient meets sepsis criteria, sepsis protocol initiated   Cecil Cobbs, Cordelia Poche 08/14/21 2001

## 2021-08-15 ENCOUNTER — Other Ambulatory Visit: Payer: Self-pay

## 2021-08-15 ENCOUNTER — Encounter (HOSPITAL_COMMUNITY): Payer: Self-pay

## 2021-08-15 ENCOUNTER — Emergency Department (HOSPITAL_COMMUNITY)
Admission: EM | Admit: 2021-08-15 | Discharge: 2021-08-15 | Disposition: A | Discharge status: Home / Self Care | Payer: Self-pay | Attending: Emergency Medicine | Admitting: Emergency Medicine

## 2021-08-15 DIAGNOSIS — Z760 Encounter for issue of repeat prescription: Secondary | ICD-10-CM | POA: Insufficient documentation

## 2021-08-15 MED ORDER — TRAZODONE HCL 50 MG PO TABS: 50.0000 mg | ORAL_TABLET | Freq: Every day | ORAL | 0 refills | Status: DC

## 2021-08-15 NOTE — ED Notes (Signed)
Called pt for room but no answer and pt not seen in lobby or outside

## 2021-08-15 NOTE — ED Provider Notes (Signed)
[facility] EMERGENCY DEPARTMENT Provider Note   CSN: [id number] Arrival date & time: 08/15/21  2112     History  Chief Complaint  Patient presents with   Medication Refill    Sean King is a 36 y.o. male.  The history is provided by the patient and medical records.  Medication Refill  36 year old male with history of adjustment disorder, PTSD, sleep disturbance, presenting to the ED requesting refill of his trazodone.  States he is actually been out of this for over 1 week.  Denies SI/HI/AVH.  Home Medications Prior to Admission medications   Medication Sig Start Date End Date Taking? Authorizing Provider  traZODone (DESYREL) 50 MG tablet Take 1 tablet (50 mg total) by mouth at bedtime. 08/15/21  Yes Garlon Hatchet, PA-C  hydrOXYzine (ATARAX) 25 MG tablet Take 1 tablet (25 mg total) by mouth 3 (three) times daily as needed. 05/13/21   Nwoko, Tommas Olp, PA      Allergies    Coconut (cocos nucifera), Hydrocortisone, and Neosporin [neomycin-bacitracin zn-polymyx]    Review of Systems   Review of Systems  Constitutional:        Med refill  All other systems reviewed and are negative.   Physical Exam Updated Vital Signs BP 131/77   Pulse 75   Temp 98.9 F (37.2 C) (Oral)   Resp 16   SpO2 97%   Physical Exam Vitals and nursing note reviewed.  Constitutional:      Appearance: He is well-developed.  HENT:     Head: Normocephalic and atraumatic.  Eyes:     Conjunctiva/sclera: Conjunctivae normal.     Pupils: Pupils are equal, round, and reactive to light.  Cardiovascular:     Rate and Rhythm: Normal rate and regular rhythm.     Heart sounds: Normal heart sounds.  Pulmonary:     Effort: Pulmonary effort is normal. No respiratory distress.     Breath sounds: Normal breath sounds. No rhonchi.  Musculoskeletal:        General: Normal range of motion.     Cervical back: Normal range of motion.  Skin:    General: Skin is warm and dry.   Neurological:     Mental Status: He is alert and oriented to person, place, and time.     ED Results / Procedures / Treatments   Labs (all labs ordered are listed, but only abnormal results are displayed) Labs Reviewed - No data to display  EKG None  Radiology DG Chest 2 View  Result Date: 08/14/2021 CLINICAL DATA:  Headache and fever EXAM: CHEST - 2 VIEW COMPARISON:  Radiographs 08/29/2017 FINDINGS: No focal consolidation, pleural effusion, or pneumothorax. Normal cardiomediastinal silhouette. No acute osseous abnormality. IMPRESSION: No active cardiopulmonary disease. Electronically Signed   By: Minerva Fester M.D.   On: 08/14/2021 20:10    Procedures Procedures    Medications Ordered in ED Medications - No data to display  ED Course/ Medical Decision Making/ A&P                           Medical Decision Making Risk Prescription drug management.   36 y.o. M here requesting refill of trazadone, out for over 1 week.  Does not appear this has been filled since May 2023.  Trazadone refilled for 1 month to allow time to see PCP/psychiatry.  Can return here for new concerns.  Final Clinical Impression(s) / ED Diagnoses Final diagnoses:  Medication  refill    Rx / DC Orders ED Discharge Orders          Ordered    traZODone (DESYREL) 50 MG tablet  Daily at bedtime        08/15/21 2306              Garlon Hatchet, PA-C 08/15/21 0093    Eber Hong, MD 08/18/21 [phone]

## 2021-08-15 NOTE — ED Triage Notes (Signed)
Pt here for refill of trazadone for anxiety

## 2021-08-19 LAB — CULTURE, BLOOD (ROUTINE X 2)
Culture: NO GROWTH
Culture: NO GROWTH
Special Requests: ADEQUATE

## 2021-08-23 ENCOUNTER — Ambulatory Visit: Payer: Self-pay | Admitting: Family

## 2021-08-26 ENCOUNTER — Telehealth (HOSPITAL_COMMUNITY): Payer: No Payment, Other | Admitting: Physician Assistant

## 2021-08-29 ENCOUNTER — Ambulatory Visit (HOSPITAL_COMMUNITY)
Admission: EM | Admit: 2021-08-29 | Discharge: 2021-08-30 | Disposition: A | Discharge status: Other Acute Inpatient Hospital | Payer: No Payment, Other

## 2021-08-29 ENCOUNTER — Ambulatory Visit (HOSPITAL_COMMUNITY)
Admission: RE | Admit: 2021-08-29 | Discharge: 2021-08-29 | Disposition: A | Discharge status: Home / Self Care | Payer: No Payment, Other | Attending: Psychiatry | Admitting: Psychiatry

## 2021-08-29 DIAGNOSIS — R4584 Anhedonia: Secondary | ICD-10-CM | POA: Insufficient documentation

## 2021-08-29 DIAGNOSIS — F4312 Post-traumatic stress disorder, chronic: Secondary | ICD-10-CM

## 2021-08-29 DIAGNOSIS — U071 COVID-19: Secondary | ICD-10-CM | POA: Insufficient documentation

## 2021-08-29 DIAGNOSIS — F411 Generalized anxiety disorder: Secondary | ICD-10-CM | POA: Insufficient documentation

## 2021-08-29 DIAGNOSIS — F4310 Post-traumatic stress disorder, unspecified: Secondary | ICD-10-CM | POA: Insufficient documentation

## 2021-08-29 DIAGNOSIS — F29 Unspecified psychosis not due to a substance or known physiological condition: Secondary | ICD-10-CM

## 2021-08-29 DIAGNOSIS — F333 Major depressive disorder, recurrent, severe with psychotic symptoms: Secondary | ICD-10-CM

## 2021-08-29 DIAGNOSIS — F32A Depression, unspecified: Secondary | ICD-10-CM | POA: Insufficient documentation

## 2021-08-29 DIAGNOSIS — G479 Sleep disorder, unspecified: Secondary | ICD-10-CM

## 2021-08-29 DIAGNOSIS — F22 Delusional disorders: Secondary | ICD-10-CM | POA: Insufficient documentation

## 2021-08-29 DIAGNOSIS — R441 Visual hallucinations: Secondary | ICD-10-CM

## 2021-08-29 DIAGNOSIS — F4323 Adjustment disorder with mixed anxiety and depressed mood: Secondary | ICD-10-CM

## 2021-08-29 LAB — SARS CORONAVIRUS 2 BY RT PCR: SARS Coronavirus 2 by RT PCR: POSITIVE — AB

## 2021-08-29 NOTE — H&P (Signed)
Behavioral Health Medical Screening Exam  Sean King is a 36 y.o. male presenting voluntarily to [facility] for   Total Time spent with patient: 45 minutes  Psychiatric Specialty Exam:  Presentation  General Appearance: Casual  Eye Contact:Good  Speech:Clear and Coherent  Speech Volume:Normal  Handedness:Right   Mood and Affect  Mood:Anxious; Depressed; Hopeless  Affect:Blunt   Thought Process  Thought Processes:Coherent  Descriptions of Associations:Intact  Orientation:Full (Time, Place and Person)  Thought Content:Delusions  History of Schizophrenia/Schizoaffective disorder:No  Duration of Psychotic Symptoms:Greater than six months  Hallucinations:Hallucinations: Auditory Description of Auditory Hallucinations: have heard birds noises in the middle of night  Ideas of Reference:Paranoia  Suicidal Thoughts:Suicidal Thoughts: No  Homicidal Thoughts:Homicidal Thoughts: No   Sensorium  Memory:Immediate Good; Remote Fair; Recent Fair  Judgment:Good  Insight:Good   Executive Functions  Concentration:Good  Attention Span:Good  Recall:Good  Fund of Knowledge:Good  Language:Good   Psychomotor Activity  Psychomotor Activity:Psychomotor Activity: Normal   Assets  Assets:Communication Skills; Housing; Physical Health; Resilience; Desire for Improvement; Vocational/Educational   Sleep  Sleep:Sleep: Poor Number of Hours of Sleep: -1    Physical Exam: Physical Exam Constitutional:      Appearance: Normal appearance.  HENT:     Head: Normocephalic and atraumatic.  Eyes:     Pupils: Pupils are equal, round, and reactive to light.  Cardiovascular:     Rate and Rhythm: Normal rate.  Pulmonary:     Effort: Pulmonary effort is normal.  Abdominal:     General: Abdomen is flat.  Musculoskeletal:        General: Normal range of motion.     Cervical back: Normal range of motion.  Neurological:     Mental Status: He is alert and oriented to  person, place, and time.  Psychiatric:        Mood and Affect: Mood normal.    Review of Systems  Constitutional: Negative.   HENT: Negative.    Eyes: Negative.   Respiratory: Negative.    Cardiovascular: Negative.   Gastrointestinal: Negative.   Genitourinary: Negative.   Musculoskeletal: Negative.   Skin: Negative.   Neurological: Negative.   Endo/Heme/Allergies: Negative.   Psychiatric/Behavioral:  Positive for depression and hallucinations. Negative for suicidal ideas. The patient is nervous/anxious and has insomnia.    Blood pressure (!) 145/83, pulse 63, temperature 98.5 F (36.9 C), temperature source Oral, resp. rate 12. There is no height or weight on file to calculate BMI.  Musculoskeletal: Strength & Muscle Tone: within normal limits Gait & Station: normal Patient leans: N/A  [place] Scale:  Flowsheet Row [facility] from 08/15/2021 in [facility] EMERGENCY DEPARTMENT ED from 08/14/2021 in [facility] EMERGENCY DEPARTMENT  C-SSRS RISK CATEGORY Low Risk No Risk No Risk       Recommendations:  Based on my evaluation the patient does not appear to have an emergency medical condition.  Jasper Riling, NP 08/29/2021, 10:04 PM

## 2021-08-30 ENCOUNTER — Encounter (HOSPITAL_COMMUNITY): Payer: Self-pay | Admitting: Emergency Medicine

## 2021-08-30 ENCOUNTER — Other Ambulatory Visit: Payer: Self-pay

## 2021-08-30 ENCOUNTER — Emergency Department (HOSPITAL_COMMUNITY)
Admission: EM | Admit: 2021-08-30 | Discharge: 2021-08-31 | Disposition: A | Discharge status: Home / Self Care | Payer: Self-pay | Attending: Emergency Medicine | Admitting: Emergency Medicine

## 2021-08-30 DIAGNOSIS — F4312 Post-traumatic stress disorder, chronic: Secondary | ICD-10-CM | POA: Diagnosis present

## 2021-08-30 DIAGNOSIS — U071 COVID-19: Secondary | ICD-10-CM | POA: Insufficient documentation

## 2021-08-30 DIAGNOSIS — R443 Hallucinations, unspecified: Secondary | ICD-10-CM | POA: Insufficient documentation

## 2021-08-30 DIAGNOSIS — G479 Sleep disorder, unspecified: Secondary | ICD-10-CM | POA: Diagnosis present

## 2021-08-30 MED ORDER — THIAMINE HCL 100 MG/ML IJ SOLN
100.0000 mg | Freq: Every day | INTRAMUSCULAR | Status: DC
  Filled 2021-08-30: qty 2

## 2021-08-30 MED ORDER — ACETAMINOPHEN 325 MG PO TABS: 650.0000 mg | ORAL_TABLET | ORAL | Status: DC | PRN

## 2021-08-30 MED ORDER — ONDANSETRON HCL 4 MG PO TABS
4.0000 mg | ORAL_TABLET | Freq: Three times a day (TID) | ORAL | Status: DC | PRN
Start: 2021-08-30 — End: 2021-08-31

## 2021-08-30 MED ORDER — THIAMINE HCL 100 MG PO TABS
100.0000 mg | ORAL_TABLET | Freq: Every day | ORAL | Status: DC
  Administered 2021-08-30 – 2021-08-31 (×2): 100 mg via ORAL
  Filled 2021-08-30 (×2): qty 1

## 2021-08-30 MED ORDER — TRAZODONE HCL 50 MG PO TABS
50.0000 mg | ORAL_TABLET | Freq: Every day | ORAL | Status: DC
  Administered 2021-08-30: 50 mg via ORAL
  Filled 2021-08-30: qty 1

## 2021-08-30 MED ORDER — LORAZEPAM 1 MG PO TABS: 0.0000 mg | ORAL_TABLET | Freq: Two times a day (BID) | ORAL | Status: DC

## 2021-08-30 MED ORDER — LORAZEPAM 2 MG/ML IJ SOLN: 0.0000 mg | Freq: Two times a day (BID) | INTRAMUSCULAR | Status: DC

## 2021-08-30 MED ORDER — HYDROXYZINE HCL 25 MG PO TABS: 25.0000 mg | ORAL_TABLET | Freq: Three times a day (TID) | ORAL | Status: DC | PRN

## 2021-08-30 NOTE — ED Notes (Signed)
PA Eddie Nwoko notified pt is COVID Positive.

## 2021-08-30 NOTE — ED Triage Notes (Signed)
BIBA from [facility] due to COVID+ asymptomatic he is experiencing A/V hallucinations, has depression, anxiety, and insomnia

## 2021-08-30 NOTE — ED Provider Notes (Signed)
Behavioral Health Urgent Care Medical Screening Exam  Patient Name: Sean King MRN: [id number] Date of Evaluation: 08/30/21 Chief Complaint:   Diagnosis:  Final diagnoses:  Visual hallucination  Severe episode of recurrent major depressive disorder, with psychotic features (HCC)    History of Present illness: Sean King is a 36 y.o. male with a past psychiatric history significant for chronic posttraumatic stress disorder, adjustment disorder, and sleep disturbances who presents to [facility] following transfer from [facility].  Patient first presented to [facility] as a walk-in with a chief complaint of worsening symptoms not relieved from the use of his medications.  Prior to being seen at [facility], patient states that he had not been getting any better even through the use of his medications.  Patient is currently taking trazodone for sleep and hydroxyzine for the management of his anxiety.  He states that when he took trazodone, it helped with allowing him to sleep but it also made him disoriented upon waking.  On occasion, patient states that he would take trazodone and hydroxyzine at the same time when his head would hurt.  Patient presented to [facility] after expressing to his Laney Potash he was ready to die.  Patient expresses that he has been experiencing depression 3-4 times per week.  Patient states that his depressive symptoms come and go and reports that there are moments where he is energized that quickly transition to moments where he is dull.  Patient endorses decreased motivation stating that he finds himself procrastinating when doing an activity due to constant ruminating which in turn, caused him to lose track of time.  Patient reports that he lost his job on Friday but when he was working, he states that he was able to focus over 2-hour intervals.  Since losing his job, patient states that he has moved back in with his mother.  Patient expresses  irritability that comes and goes and states that he is frustrated about things that have occurred in his past.  He endorses shame and guilt and states that he constantly over analyzes and ruminates over past actions/decisions.  Patient expresses some anxiety attributed to presenting to this location.  He states that he is skeptical about receiving help but is motivated to feel better.  He states that he has not been himself for a long time and is willing to do what it takes to make him better again.  Patient stressors include not working, moving out of his previous house, bills, loss of direction, and loss of self.  Patient also states that he has been without trazodone for more than 30 days and has not been able to take the medication.  Patient denies suicidal or homicidal ideations.  He denies auditory or visual hallucinations at first but then later admitted that he has been experiencing visual hallucinations characterized by seeing words that appear in all caps as acronyms.  For example, patient states that the word MEDLINE appears as Make Every Day Last It Never Ends.  Patient endorses poor sleep and states that he is only able to sleep 4 hours at a time.  Patient states that his body often jolts back awake and is unable to fall back asleep.  Patient endorses decreased appetite stating that he does not eat as much as he used to.  Patient denies tobacco use, alcohol consumption, and illicit drug use.  Patient states that he has not been able to function at full capacity and is open to the idea  of inpatient psychiatry.  Following conclusion of the assessment, it was uncovered that patient tested positive for COVID.  Due to patient testing positive for COVID, patient was transferred over to [facility] ED until he tests negative for COVID.  Once patient tests negative for COVID, patient to be admitted to [facility].  Psychiatric Specialty Exam  Presentation  General Appearance:Casual  Eye  Contact:Good  Speech:Clear and Coherent  Speech Volume:Normal  Handedness:Right   Mood and Affect  Mood:Anxious; Depressed; Hopeless  Affect:Congruent; Depressed; Blunt   Thought Process  Thought Processes:Coherent; Goal Directed  Descriptions of Associations:Intact  Orientation:Full (Time, Place and Person)  Thought Content:Rumination; Delusions  Diagnosis of Schizophrenia or Schizoaffective disorder in past: No  Duration of Psychotic Symptoms: Greater than six months  Hallucinations:Visual have heard birds noises in the middle of night Patient reports that anytime he sees words that are in all caps, they are instantly turned into an acronym  Ideas of Reference:Paranoia  Suicidal Thoughts:No Without Plan  Homicidal Thoughts:No Without Plan   Sensorium  Memory:Immediate Good; Recent Fair; Remote Fair  Judgment:Good  Insight:Good   Executive Functions  Concentration:Good  Attention Span:Good  Recall:Good  Fund of Knowledge:Good  Language:Good   Psychomotor Activity  Psychomotor Activity:Normal   Assets  Assets:Communication Skills; Desire for Improvement; Housing; Resilience; Vocational/Educational   Sleep  Sleep:Poor  Number of hours: -1   No data recorded  Physical Exam: Physical Exam Psychiatric:        Attention and Perception: Attention normal. He perceives visual hallucinations.        Mood and Affect: Mood is anxious and depressed. Affect is blunt.        Speech: Speech normal.        Behavior: Behavior normal. Behavior is cooperative.        Thought Content: Thought content is paranoid. Thought content does not include homicidal or suicidal ideation. Thought content does not include suicidal plan.        Cognition and Memory: Cognition and memory normal.        Judgment: Judgment normal.   Review of Systems  Psychiatric/Behavioral:  Positive for depression and hallucinations. Negative for memory loss, substance abuse and  suicidal ideas. The patient is nervous/anxious and has insomnia.    Blood pressure (!) 151/112, pulse 60, temperature 98.1 F (36.7 C), resp. rate 18, SpO2 100 %. There is no height or weight on file to calculate BMI.  Musculoskeletal: Strength & Muscle Tone: within normal limits Gait & Station: normal Patient leans: N/A   [facility] MSE Discharge Disposition for Follow up and Recommendations: Based on my evaluation the patient appears to have an emergency medical condition for which I recommend the patient be transferred to the emergency department for further evaluation.  After the assessment, it was uncovered that the patient tested positive for COVID.  Due to patient testing positive for COVID, patient was transferred over to [facility], ED until symptoms cleared.  Once patient tests negative for COVID, patient to be accepted to [facility] for inpatient psychiatry.  Meta Hatchet, PA 08/30/2021, 1:28 AM

## 2021-08-30 NOTE — ED Notes (Signed)
[facility] called for transport to [facility] ED, [facility] transport.  Pt is COVID Positive.

## 2021-08-30 NOTE — ED Provider Notes (Signed)
[facility]-EMERGENCY DEPT Provider Note: Lowella Dell, MD, FACEP  CSN: [id number] MRN: [id number] ARRIVAL: 08/30/21 at 0242 ROOM: WA24/WA24   CHIEF COMPLAINT  Hallucinations   HISTORY OF PRESENT ILLNESS  08/30/21 3:04 AM Sean King is a 36 y.o. male who was seen at [facility] for auditory and visual hallucinations and was deemed suitable for inpatient treatment.  He was incidentally found positive for COVID despite being asymptomatic.  He was sent here to board until he is no longer with in the COVID quarantine window.  The patient denies any complaints to me.   Past Medical History:  Diagnosis Date   Migraines    PTSD (post-traumatic stress disorder)    Scoliosis     No past surgical history on file.  No family history on file.  Social History   Tobacco Use   Smoking status: Never   Smokeless tobacco: Never  Vaping Use   Vaping Use: Never used  Substance Use Topics   Alcohol use: Yes    Alcohol/week: 2.0 standard drinks of alcohol    Types: 2 Shots of liquor per week    Comment: twice a month   Drug use: Not Currently    Types: Marijuana    Comment: 02/2021    Prior to Admission medications   Medication Sig Start Date End Date Taking? Authorizing Provider  hydrOXYzine (ATARAX) 25 MG tablet Take 1 tablet (25 mg total) by mouth 3 (three) times daily as needed. 05/13/21   Nwoko, Tommas Olp, PA  traZODone (DESYREL) 50 MG tablet Take 1 tablet (50 mg total) by mouth at bedtime. 08/15/21   Garlon Hatchet, PA-C    Allergies Coconut (cocos nucifera), Hydrocortisone, and Neosporin [neomycin-bacitracin zn-polymyx]   REVIEW OF SYSTEMS  Negative except as noted here or in the History of Present Illness.   PHYSICAL EXAMINATION  Initial Vital Signs Blood pressure (!) 153/106, pulse (!) 58, temperature (!) 97.4 F (36.3 C), temperature source Oral, resp. rate 13, height 6\' 3"  (1.905 m), weight 89.8 kg, SpO2 100 %.  Examination General: Well-developed, well-nourished male in  no acute distress; appearance consistent with age of record HENT: normocephalic; atraumatic Eyes: Normal appearance Neck: supple Heart: regular rate and rhythm Lungs: Normal respiratory effort and excursion Abdomen: soft; nondistended Extremities: No deformity; full range of motion Neurologic: Awake, alert; motor function intact in all extremities and symmetric; no facial droop Skin: Warm and dry Psychiatric: Pleasant, cooperative   RESULTS  Summary of this visit's results, reviewed and interpreted by myself:   EKG Interpretation  Date/Time:    Ventricular Rate:    PR Interval:    QRS Duration:   QT Interval:    QTC Calculation:   R Axis:     Text Interpretation:         Laboratory Studies: No results found for this or any previous visit (from the past 24 hour(s)). Imaging Studies: No results found.  ED COURSE and MDM  Nursing notes, initial and subsequent vitals signs, including pulse oximetry, reviewed and interpreted by myself.  Vitals:   08/30/21 0251 08/30/21 0252  BP:  (!) 153/106  Pulse:  (!) 58  Resp:  13  Temp:  (!) 97.4 F (36.3 C)  TempSrc:  Oral  SpO2:  100%  Weight: 89.8 kg   Height: 6\' 3"  (1.905 m)    Medications  hydrOXYzine (ATARAX) tablet 25 mg (has no administration in time range)  traZODone (DESYREL) tablet 50 mg (has no administration in time range)  LORazepam (ATIVAN)  injection 0-4 mg (has no administration in time range)    Or  LORazepam (ATIVAN) tablet 0-4 mg (has no administration in time range)  thiamine (VITAMIN B1) tablet 100 mg (has no administration in time range)    Or  thiamine (VITAMIN B1) injection 100 mg (has no administration in time range)  ondansetron (ZOFRAN) tablet 4 mg (has no administration in time range)  acetaminophen (TYLENOL) tablet 650 mg (has no administration in time range)   Holding orders placed.   PROCEDURES  Procedures   ED DIAGNOSES     ICD-10-CM   1. Hallucinations  R44.3     2. COVID-19  virus infection  U07.1          Wray Goehring, MD 08/30/21 [phone]

## 2021-08-30 NOTE — Discharge Instructions (Addendum)
Patient to be transferred over to [facility] ED.

## 2021-08-30 NOTE — ED Notes (Signed)
Pt has x1 black & red duffle bag, x1 pt belonging bag, labeled and placed at 23-25 belonging cabinet.

## 2021-08-30 NOTE — ED Provider Notes (Signed)
Emergency Medicine Observation Re-evaluation Note  Sean King is a 36 y.o. male, seen on rounds today.  Pt initially presented to the ED for complaints of Hallucinations Currently, the patient is asleep in bed resting comfortably.  Physical Exam  BP 109/74   Pulse (!) 51   Temp (!) 97.4 F (36.3 C) (Oral)   Resp 10   Ht 6\' 3"  (1.905 m)   Wt 89.8 kg   SpO2 99%   BMI 24.74 kg/m  Physical Exam General: Asleep, no acute distress Cardiac: Bradycardic, regular rhythm Lungs: Pulmonary effort normal Psych: Calm, cooperative  ED Course / MDM  EKG:   I have reviewed the labs performed to date as well as medications administered while in observation.  Recent changes in the last 24 hours include patient was medically cleared over night with no signs or symptoms of his COVID-19 infection.  Plan  Current plan is for psychiatric evaluation, placement pending negative COVID test.  is not under involuntary commitment.     Gaye Pollack, DO 08/30/21 [phone]

## 2021-08-31 DIAGNOSIS — F4312 Post-traumatic stress disorder, chronic: Secondary | ICD-10-CM

## 2021-08-31 NOTE — ED Notes (Signed)
Pt provided breakfast tray. Pt calm and cooperative at this time.

## 2021-08-31 NOTE — ED Notes (Addendum)
Patient has food tray and is sitting peacefully.

## 2021-08-31 NOTE — Discharge Instructions (Addendum)
  Discharge recommendations:  Patient is to take medications as prescribed. Please see information for follow-up appointment with psychiatry and therapy. Please follow up with your primary care provider for all medical related needs.   Therapy: We recommend that patient participate in individual therapy to address mental health concerns.  Medications: The patient is to contact a medical professional and/or outpatient provider to address any new side effects that develop. Patient should update outpatient providers of any new medications and/or medication changes.   Safety:  The patient should abstain from use of illicit substances/drugs and abuse of any medications. If symptoms worsen or do not continue to improve or if the patient becomes actively suicidal or homicidal then it is recommended that the patient return to the closest hospital emergency department, the [facility], or call 911 for further evaluation and treatment. National Suicide Prevention Lifeline 1-800-SUICIDE or (302)332-7722.  Please refrain from using alcohol or illicit substances, as they can affect your mood and can cause depression, anxiety or other concerning symptoms.  Alcohol can increase the chance that a person will make reckless decisions, like attempting suicide, and can increase the lethality of a drug overdose.     About 988 988 offers 24/7 access to trained crisis counselors who can help people experiencing mental health-related distress. People can call or text 988 or chat 988lifeline.org for themselves or if they are worried about a loved one who may need crisis support.  Crisis Mobile: [facility]:                     819-233-8579 (for crisis response 24 hours a day) [facility] Hotline:                                            954-515-6736

## 2021-08-31 NOTE — ED Notes (Signed)
Pt awake and calm at this time.

## 2021-08-31 NOTE — Discharge Summary (Signed)
[facility] ED Discharge  08/31/2021 5:27 PM Sean King  MRN:  [id number]  Principal Problem: Chronic post-traumatic stress disorder (PTSD) Discharge Diagnoses: Principal Problem:   Chronic post-traumatic stress disorder (PTSD) Active Problems:   Sleep disturbance  Clinical Impression:  Final diagnoses:  Hallucinations  COVID-19 virus infection  Chronic post-traumatic stress disorder (PTSD)  Sleep disturbance   Subjective: Sean King is a 36 y.o. divorced male with a history of PTSD, GAD and AVH presented voluntarily to [facility] for psychiatric evaluation accompanied by his sister [name] [phone].  Patient reported that he needed to be evaluated because his family had an intervention and wanted him to come to be evaluated.  Patient denied any current suicidal ideations but reported he did have some SI 3 days ago but without any specific plan or intent. Patient was transferred to [facility], subsequently tested positive for COVID and then was transferred to [facility] on 08/30/21.  Patient reports that he sometimes has difficulty distinguishing what is real and what is not real. He gives an example of his lunch tray today. States that the menu states he had a veggie burger, but there was a roast beef sandwich. He states that he knows the difference between ad veggie burger and roast beef, so he definitely knows it wasn't a veggie burger. Patient reports some paranoia at times when he is at home alone. He states that he will feel like someone else is in his place. He denies auditory hallucinations. He reports that he sometimes sees colors merge, but thinks that is related to being tired or sleepy. Gives an example of looking a clock and it appearing blurry. He denies suicidal ideations. He denies homicidal ideations.   Patient feels that being in the emergency is not helpful. He feels like the space could be better used by someone else.   Discussed starting an antipsychotic, such as  olanzapine. The patient was informed of possible adverse effects including, but not limited to: akathisia, extrapyramidal symptoms, dystonia, drowsiness, and long term concerns for tardive dyskinesia, weight gain, insulin resistance, dyslipidemia, and cognitive slowing. The patient expressed understanding. Alternatives to this medication were also discussed with the patient, including risks of not taking medications, and the patient declined to start medication at this time.    Patient has remained calm and cooperative while in the emergency department.   Patient reports that he has prescriptions for hydroxyzine and trazodone at this pharmacy that he needs to pick up.   ED Assessment Time Calculation: Start Time: 1615 Stop Time: 1645 Total Time in Minutes (Assessment Completion): 30   Past Psychiatric History: PTSD. Denies previous psychiatric hospitalizations.  Past Medical History:  Past Medical History:  Diagnosis Date   Migraines    PTSD (post-traumatic stress disorder)    Scoliosis    History reviewed. No pertinent surgical history. Family History: History reviewed. No pertinent family history.  Social History:  Social History   Substance and Sexual Activity  Alcohol Use Yes   Alcohol/week: 2.0 standard drinks of alcohol   Types: 2 Shots of liquor per week   Comment: twice a month     Social History   Substance and Sexual Activity  Drug Use Not Currently   Types: Marijuana   Comment: 02/2021    Social History   Socioeconomic History   Marital status: Divorced    Spouse name: Not on file   Number of children: Not on file   Years of education: Not on file   Highest education  level: Not on file  Occupational History   Not on file  Tobacco Use   Smoking status: Never   Smokeless tobacco: Never  Vaping Use   Vaping Use: Never used  Substance and Sexual Activity   Alcohol use: Yes    Alcohol/week: 2.0 standard drinks of alcohol    Types: 2 Shots of liquor per  week    Comment: twice a month   Drug use: Not Currently    Types: Marijuana    Comment: 02/2021   Sexual activity: Yes    Partners: Female    Birth control/protection: Condom  Other Topics Concern   Not on file  Social History Narrative   Not on file   Social Determinants of Health   Financial Resource Strain: Not on file  Food Insecurity: Not on file  Transportation Needs: Not on file  Physical Activity: Not on file  Stress: Not on file  Social Connections: Not on file    Tobacco Cessation:  A prescription for an FDA-approved tobacco cessation medication was offered at discharge and the patient refused  Current Medications: Current Facility-Administered Medications  Medication Dose Route Frequency Provider Last Rate Last Admin   acetaminophen (TYLENOL) tablet 650 mg  650 mg Oral Q4H PRN Molpus, John, MD       hydrOXYzine (ATARAX) tablet 25 mg  25 mg Oral TID PRN Molpus, John, MD       Melene Muller ON 09/01/2021] LORazepam (ATIVAN) injection 0-4 mg  0-4 mg Intravenous Q12H Molpus, John, MD       Or   Melene Muller ON 09/01/2021] LORazepam (ATIVAN) tablet 0-4 mg  0-4 mg Oral Q12H Molpus, John, MD       ondansetron [facility]) tablet 4 mg  4 mg Oral Q8H PRN Molpus, John, MD       thiamine (VITAMIN B1) tablet 100 mg  100 mg Oral Daily Molpus, John, MD   100 mg at 08/31/21 1026   Or   thiamine (VITAMIN B1) injection 100 mg  100 mg Intravenous Daily Molpus, John, MD       traZODone (DESYREL) tablet 50 mg  50 mg Oral QHS Molpus, John, MD   50 mg at 08/30/21 2151   Current Outpatient Medications  Medication Sig Dispense Refill   traZODone (DESYREL) 50 MG tablet Take 1 tablet (50 mg total) by mouth at bedtime. 30 tablet 0   hydrOXYzine (ATARAX) 25 MG tablet Take 1 tablet (25 mg total) by mouth 3 (three) times daily as needed. (Patient not taking: Reported on 08/30/2021) 90 tablet 1   PTA Medications: (Not in a hospital admission)   [place] Scale:  Flowsheet Row ED from 08/30/2021 in [facility] OP [facility] from 08/15/2021 in [facility] EMERGENCY DEPARTMENT  C-SSRS RISK CATEGORY No Risk Low Risk No Risk       Musculoskeletal: Strength & Muscle Tone: within normal limits Gait & Station: normal Patient leans: N/A  Psychiatric Specialty Exam: Presentation  General Appearance: Appropriate for Environment; Casual  Eye Contact:Good  Speech:Clear and Coherent; Normal Rate  Speech Volume:Normal  Handedness:Right   Mood and Affect  Mood:Anxious  Affect:Congruent   Thought Process  Thought Processes:Coherent; Goal Directed; Linear  Descriptions of Associations:Intact  Orientation:Full (Time, Place and Person)  Thought Content:Paranoid Ideation  History of Schizophrenia/Schizoaffective disorder:No  Duration of Psychotic Symptoms:Greater than six months  Hallucinations:Hallucinations: None Description of Visual Hallucinations: Patient reports that anytime he sees words  that are in all caps, they are instantly turned into an acronym  Ideas of Reference:Paranoia  Suicidal Thoughts:Suicidal Thoughts: No  Homicidal Thoughts:Homicidal Thoughts: No   Sensorium  Memory:Immediate Good; Recent Good; Remote Good  Judgment:Good  Insight:Good   Executive Functions  Concentration:Good  Attention Span:Good  Recall:Good  Fund of Knowledge:Good  Language:Good   Psychomotor Activity  Psychomotor Activity:Psychomotor Activity: Normal   Assets  Assets:Communication Skills; Desire for Improvement; Housing; Physical Health   Sleep  Sleep:Sleep: Fair    Physical Exam: Physical Exam Constitutional:      General: He is not in acute distress.    Appearance: He is not ill-appearing, toxic-appearing or diaphoretic.  HENT:     Right Ear: External ear normal.     Left Ear: External ear normal.  Eyes:     General:        Right eye: No  discharge.        Left eye: No discharge.  Cardiovascular:     Rate and Rhythm: Normal rate.  Pulmonary:     Effort: Pulmonary effort is normal. No respiratory distress.  Musculoskeletal:        General: Normal range of motion.     Cervical back: Normal range of motion.  Neurological:     Mental Status: He is alert and oriented to person, place, and time.  Psychiatric:        Speech: Speech normal.        Behavior: Behavior is cooperative.        Thought Content: Thought content does not include homicidal or suicidal ideation.    Review of Systems  Constitutional:  Negative for chills, diaphoresis, fever, malaise/fatigue and weight loss.  HENT:  Negative for congestion.   Respiratory:  Negative for cough and shortness of breath.   Cardiovascular:  Negative for chest pain and palpitations.  Gastrointestinal:  Negative for diarrhea, nausea and vomiting.  Neurological:  Negative for dizziness and seizures.  Psychiatric/Behavioral:  Negative for hallucinations, memory loss and suicidal ideas. The patient is nervous/anxious and has insomnia.   All other systems reviewed and are negative.  Blood pressure (!) 134/96, pulse (!) 51, temperature 98.5 F (36.9 C), temperature source Oral, resp. rate 16, height 6\' 3"  (1.905 m), weight 89.8 kg, SpO2 97 %. Body mass index is 24.74 kg/m.   Demographic Factors:  Male, Low socioeconomic status, and Unemployed  Loss Factors: Financial problems/change in socioeconomic status  Historical Factors: Family history of mental illness or substance abuse  Risk Reduction Factors:   Sense of responsibility to family, Religious beliefs about death, and Positive social support  Continued Clinical Symptoms:  Previous Psychiatric Diagnoses and Treatments  Cognitive Features That Contribute To Risk:  None    Suicide Risk:  Mild:  Suicidal ideation of limited frequency, intensity, duration, and specificity.  There are no identifiable plans, no  associated intent, mild dysphoria and related symptoms, good self-control (both objective and subjective assessment), few other risk factors, and identifiable protective factors, including available and accessible social support.   Follow-up Information     [facility] Follow up.   Specialty: Urgent Care Why: As needed, If symptoms worsen  keep follow up appointments Contact information: [address] [place] [phone]                Medical Decision Making: At time of discharge, patient denies SI, HI, AVH and is able to contract for safety. He demonstrated no overt evidence of  psychosis or mania. Prior to discharge the patient verbalized understanding of the warning signs, triggers, and symptoms of worsening mental health and how to access emergency mental health care if they felt it was needed. Patient was instructed to call 911 or return to the emergency room if they experienced any concerning symptoms after discharge. Patient voiced understanding and agreed to this.  Problem 1: PTSD   Disposition: No evidence of imminent risk to self or others at present.   Supportive therapy provided about ongoing stressors. Discussed crisis plan, support from social network, calling 911, coming to the Emergency Department, and calling Suicide Hotline.     Discharge Instructions       Discharge recommendations:  Patient is to take medications as prescribed. Please see information for follow-up appointment with psychiatry and therapy. Please follow up with your primary care provider for all medical related needs.   Therapy: We recommend that patient participate in individual therapy to address mental health concerns.  Medications: The patient is to contact a medical professional and/or outpatient provider to address any new side effects that develop. Patient should update outpatient providers of any new medications and/or  medication changes.   Safety:  The patient should abstain from use of illicit substances/drugs and abuse of any medications. If symptoms worsen or do not continue to improve or if the patient becomes actively suicidal or homicidal then it is recommended that the patient return to the closest hospital emergency department, the [facility], or call 911 for further evaluation and treatment. National Suicide Prevention Lifeline 1-800-SUICIDE or 873-289-6680.  Please refrain from using alcohol or illicit substances, as they can affect your mood and can cause depression, anxiety or other concerning symptoms.  Alcohol can increase the chance that a person will make reckless decisions, like attempting suicide, and can increase the lethality of a drug overdose.     About 988 988 offers 24/7 access to trained crisis counselors who can help people experiencing mental health-related distress. People can call or text 988 or chat 988lifeline.org for themselves or if they are worried about a loved one who may need crisis support.  Crisis Mobile: [facility]:                     (502) 068-5655 (for crisis response 24 hours a day) [facility] Hotline:                                            901-730-7417       Jackelyn Poling, NP 08/31/2021, 5:27 PM

## 2021-08-31 NOTE — ED Notes (Signed)
Pt ready for discharge. States that sister is going to pick him up from ED, she just needed to drop her kids off at home due to his COVID pos status. Consulting civil engineer notified. Pt to wait in TCU room until ride is here.

## 2021-08-31 NOTE — ED Provider Notes (Signed)
Emergency Medicine Observation Re-evaluation Note  Sean King is a 36 y.o. male,with a history of PTSD, GAD and AVH presented voluntarily to [facility] for psychiatric evaluation accompanied by his sister [name] [phone].  Patient reported that he needed to be evaluated because his family had an intervention and wanted him to come to be evaluated.  Patient denied any current suicidal ideations but reported he did have some SI 3 days ago but without any specific plan or intent. Patient was transferred to [facility], subsequently tested positive for COVID and then was transferred to [facility] on 08/30/21.  Physical Exam  BP (!) 134/96   Pulse (!) 51   Temp 98.5 F (36.9 C) (Oral)   Resp 16   Ht 6\' 3"  (1.905 m)   Wt 89.8 kg   SpO2 97%   BMI 24.74 kg/m  Physical Exam General: calm NAD Cardiac: well perfused Lungs: O2 sat 97 on RA Psych: calm, NAD  ED Course / MDM  EKG:   I have reviewed the labs performed to date as well as medications administered while in observation.  Recent changes in the last 24 hours include med obs, Tts evaluation.  Plan  Current plan is for discharge home with psych resources.  Patient cleared by TTS.  He is in agreement with this plan.  He is COVID-positive but has no risk factors and no respiratory distress, no need for Paxlovid.    , MD 08/31/21 [phone]

## 2021-11-04 ENCOUNTER — Encounter (HOSPITAL_COMMUNITY): Payer: Self-pay

## 2021-11-04 ENCOUNTER — Emergency Department (HOSPITAL_COMMUNITY)
Admission: EM | Admit: 2021-11-04 | Discharge: 2021-11-04 | Disposition: A | Discharge status: Home / Self Care | Payer: Self-pay | Attending: Emergency Medicine | Admitting: Emergency Medicine

## 2021-11-04 ENCOUNTER — Other Ambulatory Visit: Payer: Self-pay

## 2021-11-04 DIAGNOSIS — G479 Sleep disorder, unspecified: Secondary | ICD-10-CM | POA: Insufficient documentation

## 2021-11-04 NOTE — ED Triage Notes (Signed)
Requesting information on MyChart login. Also not sure if he's having a mental health episode since he has been walking all day.   Denies Si/ Hi. When asked about hallucinations he says yes but does not elaborate.

## 2021-11-04 NOTE — ED Provider Notes (Signed)
[facility] EMERGENCY DEPARTMENT Provider Note   CSN: [id number] Arrival date & time: 11/04/21  0456     History  Chief Complaint  Patient presents with   Psychiatric Evaluation    Sean King is a 36 y.o. male.  The history is provided by the patient and medical records.   36 year old male with history of adjustment disorder, sleep disturbance, PTSD, presenting to the ED for sleep disturbance.  States he has not had any money to pick up his medications so has been off his trazodone and Atarax for a while now.  Does admit to only sleeping a few hours a night as he wakes up very often and cannot stay asleep.  Does admit to some auditory hallucinations, however that is chronic and unchanged from baseline.  He denies any command hallucinations.  Denies any SI/HI.  Denies any alcohol or drug use.  He does have a psychiatrist that he is established with but has not had any recent follow-up.  Also request information about his MyChart--apparently his account is linked to an email that is no longer active and so now he can no longer access his MyChart.  Home Medications Prior to Admission medications   Medication Sig Start Date End Date Taking? Authorizing Provider  hydrOXYzine (ATARAX) 25 MG tablet Take 1 tablet (25 mg total) by mouth 3 (three) times daily as needed. Patient not taking: Reported on 08/30/2021 05/13/21   Malachy Mood, PA  traZODone (DESYREL) 50 MG tablet Take 1 tablet (50 mg total) by mouth at bedtime. 08/15/21   Larene Pickett, PA-C      Allergies    Neomycin-bacitracin zn-polymyx, Coconut (cocos nucifera), and Hydrocortisone    Review of Systems   Review of Systems  Psychiatric/Behavioral:  Positive for sleep disturbance.   All other systems reviewed and are negative.   Physical Exam Updated Vital Signs BP (!) 143/100 (BP Location: Right Arm)   Pulse 71   Temp (!) 97.2 F (36.2 C)   Resp 16   Ht 6\' 3"  (1.905 m)   Wt 89.4 kg   SpO2  100%   BMI 24.62 kg/m  Physical Exam Vitals and nursing note reviewed.  Constitutional:      Appearance: He is well-developed.  HENT:     Head: Normocephalic and atraumatic.  Eyes:     Conjunctiva/sclera: Conjunctivae normal.     Pupils: Pupils are equal, round, and reactive to light.  Cardiovascular:     Rate and Rhythm: Normal rate and regular rhythm.     Heart sounds: Normal heart sounds.  Pulmonary:     Effort: Pulmonary effort is normal.     Breath sounds: Normal breath sounds.  Abdominal:     General: Bowel sounds are normal.     Palpations: Abdomen is soft.  Musculoskeletal:        General: Normal range of motion.     Cervical back: Normal range of motion.  Skin:    General: Skin is warm and dry.  Neurological:     Mental Status: He is alert and oriented to person, place, and time.  Psychiatric:     Comments: Denies SI/HI Fluid conversation, not responding to internal stimuli     ED Results / Procedures / Treatments   Labs (all labs ordered are listed, but only abnormal results are displayed) Labs Reviewed - No data to display  EKG None  Radiology No results found.  Procedures Procedures    Medications Ordered in ED  Medications - No data to display  ED Course/ Medical Decision Making/ A&P                           Medical Decision Making  36 year old male presenting to the ED with sleep disturbance.  Has been off of his medications for a while now due to financial reasons.  Denies any SI/HI.  He reports chronic auditory hallucinations but is not responding to internal stimuli.  He does not appear psychotic.  Asked patient several times if he feels he needs to emergent talk with psychiatry, he declined multiple times.  He states he just needs to get back on his medications and will talk with his psychiatrist.  He reports he has refills at the pharmacy, but one of them is quite expensive.  He was given Good Rx coupons for his medications, advised to  follow-up with his psychiatrist.  Also given information for local [facility] office if needed.  Also given information to help with his MyChart issues.  Can return here for new concerns.  Final Clinical Impression(s) / ED Diagnoses Final diagnoses:  Sleep trouble    Rx / DC Orders ED Discharge Orders     None         Larene Pickett, PA-C 11/04/21 0533    Quintella Reichert, MD 11/05/21 [phone]

## 2021-11-04 NOTE — Discharge Instructions (Signed)
I have printed you coupons for your medication, please pick them up and start taking them. Follow-up with your psychiatrist or [place]. Return here for new concerns.

## 2021-12-09 ENCOUNTER — Emergency Department (HOSPITAL_COMMUNITY)
Admission: EM | Admit: 2021-12-09 | Discharge: 2021-12-09 | Disposition: A | Discharge status: Home / Self Care | Payer: Self-pay | Attending: Emergency Medicine | Admitting: Emergency Medicine

## 2021-12-09 ENCOUNTER — Other Ambulatory Visit: Payer: Self-pay

## 2021-12-09 DIAGNOSIS — R21 Rash and other nonspecific skin eruption: Secondary | ICD-10-CM | POA: Insufficient documentation

## 2021-12-09 NOTE — ED Triage Notes (Signed)
Pt.  Statede Ive had a rash all over for a week. He said Ive only switched back to my old detergent one time but Lavenia Atlas been I a place that had bed bugs.

## 2021-12-09 NOTE — Discharge Instructions (Addendum)
It was a pleasure taking care of you today!   You may continue with the hydrocortisone cream as prescribed.  It may be prudent to switch out your detergents to a clean and clear detergent such as all or Tide clean and clear. You may take over the counter benadryl as needed. Attached is information for the dermatologist, you may call and set up a follow-up appointment regarding today's ED visit.  Return to the emergency department if you are experiencing increasing/worsening symptoms.

## 2021-12-09 NOTE — ED Provider Notes (Signed)
[facility] EMERGENCY DEPARTMENT Provider Note   CSN: [id number] Arrival date & time: 12/09/21  0840     History  Chief Complaint  Patient presents with   Rash    Sean King is a 36 y.o. male who presents to the ED with concerns for complains of rash to the body x 1 week.  Notes that he was using his old detergent prior to the onset of his symptoms.  Notes that he has since switched detergents.  The area was initially pruritic however that was alleviated with hydrocortisone cream.  Patient notes that he may have been in a place that had bedbugs however patient notes that he did not see any bedbugs nor was he told that the area had bedbugs.  Denies shortness of breath, drainage. He notes that he lives at home with others.   The history is provided by the patient. No language interpreter was used.       Home Medications Prior to Admission medications   Medication Sig Start Date End Date Taking? Authorizing Provider  hydrOXYzine (ATARAX) 25 MG tablet Take 1 tablet (25 mg total) by mouth 3 (three) times daily as needed. Patient not taking: Reported on 08/30/2021 05/13/21   Meta Hatchet, PA  traZODone (DESYREL) 50 MG tablet Take 1 tablet (50 mg total) by mouth at bedtime. 08/15/21   Garlon Hatchet, PA-C      Allergies    Neomycin-bacitracin zn-polymyx, Coconut (cocos nucifera), and Hydrocortisone    Review of Systems   Review of Systems  Skin:  Positive for rash.  All other systems reviewed and are negative.   Physical Exam Updated Vital Signs BP (!) 133/100 (BP Location: Right Arm)   Pulse (!) 59   Temp 98 F (36.7 C)   Resp 14   Ht 6' (1.829 m)   Wt 86.2 kg   SpO2 100%   BMI 25.77 kg/m  Physical Exam Vitals and nursing note reviewed.  Constitutional:      General: He is not in acute distress.    Appearance: He is not diaphoretic.  HENT:     Head: Normocephalic and atraumatic.     Mouth/Throat:     Pharynx: No oropharyngeal exudate.   Eyes:     General: No scleral icterus.    Conjunctiva/sclera: Conjunctivae normal.  Cardiovascular:     Rate and Rhythm: Normal rate and regular rhythm.     Pulses: Normal pulses.     Heart sounds: Normal heart sounds.  Pulmonary:     Effort: Pulmonary effort is normal. No respiratory distress.     Breath sounds: Normal breath sounds. No wheezing.  Abdominal:     General: Bowel sounds are normal.     Palpations: Abdomen is soft. There is no mass.     Tenderness: There is no abdominal tenderness. There is no guarding or rebound.  Musculoskeletal:        General: Normal range of motion.     Cervical back: Normal range of motion and neck supple.  Skin:    General: Skin is warm and dry.     Findings: Rash present. Rash is papular.     Comments: Diffuse papular rash noted to trunk, bilateral upper extremities, bilateral lower extremities.  Nonblanchable rash.  No drainage noted to the area.  Neurological:     Mental Status: He is alert.  Psychiatric:        Behavior: Behavior normal.     ED Results / Procedures /  Treatments   Labs (all labs ordered are listed, but only abnormal results are displayed) Labs Reviewed - No data to display  EKG None  Radiology No results found.  Procedures Procedures    Medications Ordered in ED Medications - No data to display  ED Course/ Medical Decision Making/ A&P                           Medical Decision Making  Pt presents with diffuse rash onset 1 week. Notes that he was in a area with bed bugs, however, didn't see any bed bugs, nor was he told about the area having bed bugs. Vital signs, pt afebrile. On exam, pt with Diffuse papular rash noted to trunk, bilateral upper extremities, bilateral lower extremities.  Nonblanchable rash.  No drainage noted to the area. Differential diagnosis includes bed bugs, contact dermatitis, scabies.    Disposition: Presentation suspicious for rash, likely due to contact dermatitis. Doubt  concerns for bed bugs at this time, no findings of clustered papules in 3's, rash is diffusely throughout body. Doubt scabies at this time, no linear burrows noted. After consideration of the diagnostic results and the patients response to treatment, I feel that the patient would benefit from Discharge home. Instructed patient to continue with his hydrocortisone cream and that he can use benadryl PRN for his symptoms. Pt provided with information for Dermatologist today for follow up. Supportive care measures and strict return precautions discussed with patient at bedside. Pt acknowledges and verbalizes understanding. Pt appears safe for discharge. Follow up as indicated in discharge paperwork.    This chart was dictated using voice recognition software, Dragon. Despite the best efforts of this provider to proofread and correct errors, errors may still occur which can change documentation meaning.  Final Clinical Impression(s) / ED Diagnoses Final diagnoses:  Rash    Rx / DC Orders ED Discharge Orders     None         Joseguadalupe Stan A, PA-C 12/09/21 1204    Cristie Hem, MD 12/10/21 1101

## 2021-12-31 ENCOUNTER — Ambulatory Visit: Payer: Self-pay | Admitting: Family Medicine

## 2021-12-31 ENCOUNTER — Encounter: Payer: Self-pay | Admitting: Family Medicine

## 2021-12-31 DIAGNOSIS — F4312 Post-traumatic stress disorder, chronic: Secondary | ICD-10-CM

## 2021-12-31 DIAGNOSIS — Z113 Encounter for screening for infections with a predominantly sexual mode of transmission: Secondary | ICD-10-CM

## 2021-12-31 NOTE — Addendum Note (Signed)
Addended by: Lenice Llamas on: 12/31/2021 04:52 PM   Modules accepted: Orders

## 2021-12-31 NOTE — Progress Notes (Signed)
[facility] Department STI clinic/screening visit  Subjective:  Delman Goshorn is a 36 y.o. male being seen today for an STI screening visit. The patient reports they do not have symptoms.    Patient has the following medical conditions:   Patient Active Problem List   Diagnosis Date Noted   Chronic post-traumatic stress disorder (PTSD) 05/13/2021   Adjustment disorder with mixed anxiety and depressed mood 05/13/2021   Sleep disturbance 05/13/2021     No chief complaint on file.   HPI  Patient reports to clinic for STI testing.   Last HIV test per patient/review of record was  Lab Results  Component Value Date   HMHIVSCREEN Negative - Validated 06/10/2021   No results found for: "HIV"  Does the patient or their partner desires a pregnancy in the next year? No  Screening for MPX risk: Does the patient have an unexplained rash? No Is the patient MSM? No Does the patient endorse multiple sex partners or anonymous sex partners? No Did the patient have close or sexual contact with a person diagnosed with MPX? No Has the patient traveled outside the [place] where MPX is endemic? No Is there a high clinical suspicion for MPX-- evidenced by one of the following No  -Unlikely to be chickenpox  -Lymphadenopathy  -Rash that present in same phase of evolution on any given body part   See flowsheet for further details and programmatic requirements.    There is no immunization history on file for this patient.   The following portions of the patient's history were reviewed and updated as appropriate: allergies, current medications, past medical history, past social history, past surgical history and problem list.  Objective:  There were no vitals filed for this visit.  Physical Exam Vitals and nursing note reviewed.  Constitutional:      Appearance: Normal appearance.  HENT:     Head: Normocephalic and atraumatic.     Mouth/Throat:     Mouth: Mucous membranes are  moist.     Pharynx: No oropharyngeal exudate or posterior oropharyngeal erythema.  Eyes:     General:        Right eye: No discharge.        Left eye: No discharge.     Conjunctiva/sclera:     Right eye: Right conjunctiva is not injected. No exudate.    Left eye: Left conjunctiva is not injected. No exudate. Pulmonary:     Effort: Pulmonary effort is normal.  Abdominal:     General: Abdomen is flat.     Palpations: Abdomen is soft. There is no hepatomegaly or mass.     Tenderness: There is no abdominal tenderness. There is no rebound.  Genitourinary:    Comments: Declined Genital exam.  Lymphadenopathy:     Cervical: No cervical adenopathy.     Upper Body:     Right upper body: No supraclavicular or axillary adenopathy.     Left upper body: No supraclavicular or axillary adenopathy.  Skin:    General: Skin is warm and dry.  Neurological:     Mental Status: He is alert and oriented to person, place, and time.       Assessment and Plan:  Vicky Mccanless is a 37 y.o. male presenting to the [facility] Department for STI screening  1. Screening for venereal disease Patient wants Urine GC/Chlamydia done today, no other testing.   - Chlamydia/GC NAA, Confirmation   Patient does not have STI symptoms Patient accepted all screenings  including   Patient meets criteria for HepB screening? Yes. Ordered? declined Patient meets criteria for HepC screening? No. Ordered? not applicable Recommended condom use with all sex Discussed importance of condom use for STI prevent  Treat per standing order Discussed time line for State Lab results and that patient will be called with positive results and encouraged patient to call if he had not heard in 2 weeks Recommended returning for continued or worsening symptoms.   No follow-ups on file.  Total time spent 20 minutes  Lenice Llamas, [place]

## 2021-12-31 NOTE — Progress Notes (Signed)
Pt. seen for routine STI screening. Pt. seen by FNP [place], no tx indicated at this time.  

## 2022-01-04 LAB — CHLAMYDIA/GC NAA, CONFIRMATION
Chlamydia trachomatis, NAA: NEGATIVE
Neisseria gonorrhoeae, NAA: NEGATIVE

## 2022-01-27 ENCOUNTER — Other Ambulatory Visit: Payer: Self-pay

## 2022-01-27 ENCOUNTER — Encounter
Admission: EM | Disposition: A | Discharge status: Home / Self Care | Payer: Self-pay | Source: Home / Self Care | Attending: Emergency Medicine

## 2022-01-27 ENCOUNTER — Emergency Department: Payer: Self-pay

## 2022-01-27 ENCOUNTER — Emergency Department
Admission: EM | Admit: 2022-01-27 | Discharge: 2022-01-27 | Disposition: A | Discharge status: Home / Self Care | Payer: Self-pay | Attending: Emergency Medicine | Admitting: Emergency Medicine

## 2022-01-27 DIAGNOSIS — R079 Chest pain, unspecified: Secondary | ICD-10-CM | POA: Insufficient documentation

## 2022-01-27 DIAGNOSIS — Z8616 Personal history of COVID-19: Secondary | ICD-10-CM | POA: Insufficient documentation

## 2022-01-27 LAB — CBC WITH DIFFERENTIAL/PLATELET
Abs Immature Granulocytes: 0.02 10*3/uL (ref 0.00–0.07)
Basophils Absolute: 0 10*3/uL (ref 0.0–0.1)
Basophils Relative: 1 %
Eosinophils Absolute: 0.1 10*3/uL (ref 0.0–0.5)
Eosinophils Relative: 1 %
HCT: 46.4 % (ref 39.0–52.0)
Hemoglobin: 14.5 g/dL (ref 13.0–17.0)
Immature Granulocytes: 0 %
Lymphocytes Relative: 47 %
Lymphs Abs: 2.5 10*3/uL (ref 0.7–4.0)
MCH: 23.7 pg — ABNORMAL LOW (ref 26.0–34.0)
MCHC: 31.3 g/dL (ref 30.0–36.0)
MCV: 75.7 fL — ABNORMAL LOW (ref 80.0–100.0)
Monocytes Absolute: 0.6 10*3/uL (ref 0.1–1.0)
Monocytes Relative: 10 %
Neutro Abs: 2.2 10*3/uL (ref 1.7–7.7)
Neutrophils Relative %: 41 %
Platelets: 207 10*3/uL (ref 150–400)
RBC: 6.13 MIL/uL — ABNORMAL HIGH (ref 4.22–5.81)
RDW: 13.7 % (ref 11.5–15.5)
WBC: 5.4 10*3/uL (ref 4.0–10.5)
nRBC: 0 % (ref 0.0–0.2)

## 2022-01-27 LAB — URINE DRUG SCREEN, QUALITATIVE (ARMC ONLY)
Amphetamines, Ur Screen: NOT DETECTED
Barbiturates, Ur Screen: NOT DETECTED
Benzodiazepine, Ur Scrn: NOT DETECTED
Cannabinoid 50 Ng, Ur ~~LOC~~: NOT DETECTED
Cocaine Metabolite,Ur ~~LOC~~: NOT DETECTED
MDMA (Ecstasy)Ur Screen: NOT DETECTED
Methadone Scn, Ur: NOT DETECTED
Opiate, Ur Screen: NOT DETECTED
Phencyclidine (PCP) Ur S: NOT DETECTED
Tricyclic, Ur Screen: NOT DETECTED

## 2022-01-27 LAB — TROPONIN I (HIGH SENSITIVITY)
Troponin I (High Sensitivity): 11 ng/L (ref <18)
Troponin I (High Sensitivity): 7 ng/L (ref <18)

## 2022-01-27 LAB — COMPREHENSIVE METABOLIC PANEL
ALT: 19 U/L (ref 0–44)
AST: 32 U/L (ref 15–41)
Albumin: 4.5 g/dL (ref 3.5–5.0)
Alkaline Phosphatase: 64 U/L (ref 38–126)
Anion gap: 7 (ref 5–15)
BUN: 17 mg/dL (ref 6–20)
CO2: 25 mmol/L (ref 22–32)
Calcium: 9.3 mg/dL (ref 8.9–10.3)
Chloride: 105 mmol/L (ref 98–111)
Creatinine, Ser: 1.17 mg/dL (ref 0.61–1.24)
GFR, Estimated: >60 mL/min (ref >60)
Glucose, Bld: 79 mg/dL (ref 70–99)
Potassium: 3.7 mmol/L (ref 3.5–5.1)
Sodium: 137 mmol/L (ref 135–145)
Total Bilirubin: 1.1 mg/dL (ref 0.3–1.2)
Total Protein: 8.1 g/dL (ref 6.5–8.1)

## 2022-01-27 LAB — HEMOGLOBIN A1C
Hgb A1c MFr Bld: 5 % (ref 4.8–5.6)
Mean Plasma Glucose: 96.8 mg/dL

## 2022-01-27 LAB — LIPID PANEL
Cholesterol: 153 mg/dL (ref 0–200)
HDL: 48 mg/dL (ref >40)
LDL Cholesterol: 94 mg/dL (ref 0–99)
Total CHOL/HDL Ratio: 3.2 RATIO
Triglycerides: 55 mg/dL (ref <150)
VLDL: 11 mg/dL (ref 0–40)

## 2022-01-27 LAB — APTT: aPTT: 33 seconds (ref 24–36)

## 2022-01-27 LAB — PROTIME-INR
INR: 1 (ref 0.8–1.2)
Prothrombin Time: 13.3 seconds (ref 11.4–15.2)

## 2022-01-27 SURGERY — CORONARY/GRAFT ACUTE MI REVASCULARIZATION
Anesthesia: Moderate Sedation

## 2022-01-27 MED ORDER — KETOROLAC TROMETHAMINE 30 MG/ML IJ SOLN
30.0000 mg | Freq: Once | INTRAMUSCULAR | Status: DC
  Filled 2022-01-27: qty 1

## 2022-01-27 NOTE — ED Provider Notes (Signed)
Emergency department handoff note  Care of this patient was signed out to me at the end of the previous provider shift.  All pertinent patient information was conveyed and all questions were answered.  Patient pending repeat troponin which was negative.  Patient states that his chest pain is resolved upon reassessment. The patient has been reexamined and is ready to be discharged.  All diagnostic results have been reviewed and discussed with the patient/family.  Care plan has been outlined and the patient/family understands all current diagnoses, results, and treatment plans.  There are no new complaints, changes, or physical findings at this time.  All questions have been addressed and answered.  Patient was instructed to, and agrees to follow-up with their primary care physician as well as return to the emergency department if any new or worsening symptoms develop.   Naaman Plummer, MD 01/27/22 1046

## 2022-01-27 NOTE — ED Triage Notes (Signed)
Patient comes by EMS for chest pain starting around 0100.   Described as tightness and nonradiating. It is more intense on laying down.  ST elevation noted.

## 2022-01-27 NOTE — Discharge Instructions (Addendum)
Please use ibuprofen (Motrin) up to 800 mg every 8 hours, naproxen (Naprosyn) up to 500 mg every 12 hours, and/or acetaminophen (Tylenol) up to 4 g/day for any continued pain.  Please do not use this medication regimen for longer than 7 days 

## 2022-01-27 NOTE — ED Provider Notes (Signed)
[facility] Provider Note    Event Date/Time   First MD Initiated Contact with Patient 01/27/22 [phone]     (approximate)   History   Chest Pain   HPI  Sean King is a 37 y.o. male with PTSD, anxiety, and migraines who presents with chest pain, acute onset around 1 AM, described as squeezing in the center of his chest.  He woke up with the pain and was not exerting himself.  Sometimes it is sharp.  It is worse with certain movements like reaching out with his arms or leaning forward.  He denies significant associated shortness of breath or lightheadedness.  He has no nausea or vomiting.  He states that he has had a milder version of the pain on and off for a few weeks but this acute episode is the worst he has felt.  I reviewed the past medical records.  The patient was seen in the psychiatric ED at [facility] on 8/29 of last year for suicidal ideation.  He also had COVID at that time.  The patient is on trazodone and Atarax.  Physical Exam   Triage Vital Signs: ED Triage Vitals  Enc Vitals Group     BP 01/27/22 0605 139/83     Pulse Rate 01/27/22 0551 65     Resp 01/27/22 0551 20     Temp 01/27/22 0551 (!) 97.5 F (36.4 C)     Temp Source 01/27/22 0551 Oral     SpO2 01/27/22 0559 100 %     Weight 01/27/22 0600 194 lb 12.8 oz (88.4 kg)     Height 01/27/22 0600 6' (1.829 m)     Head Circumference --      Peak Flow --      Pain Score 01/27/22 0600 7     Pain Loc --      Pain Edu? --      Excl. in [place]? --     Most recent vital signs: Vitals:   01/27/22 0605 01/27/22 0630  BP: 139/83 119/80  Pulse: 65 62  Resp: 14 12  Temp:    SpO2: 100% 100%     General: Alert and oriented, relatively well-appearing. CV:  Good peripheral perfusion.  Normal heart sounds.   Resp:  Normal effort.  Lungs CTAB. Abd:  No distention.  Other:  No peripheral edema.   ED Results / Procedures / Treatments   Labs (all labs ordered are listed, but only abnormal  results are displayed) Labs Reviewed  CBC WITH DIFFERENTIAL/PLATELET - Abnormal; Notable for the following components:      Result Value   RBC 6.13 (*)    MCV 75.7 (*)    MCH 23.7 (*)    All other components within normal limits  PROTIME-INR  APTT  COMPREHENSIVE METABOLIC PANEL  URINE DRUG SCREEN, QUALITATIVE ([facility] ONLY)  LIPID PANEL  HEMOGLOBIN A1C  TROPONIN I (HIGH SENSITIVITY)     EKG  ED ECG REPORT I, Arta Silence, the attending physician, personally viewed and interpreted this ECG.  Date: 01/27/2022 EKG Time: 0558 Rate: 61 Rhythm: normal sinus rhythm QRS Axis: normal Intervals: normal ST/T Wave abnormalities: Anterior ST elevations Narrative Interpretation: ST abnormalities concerning for acute MI    RADIOLOGY  Chest x-ray: Pending   PROCEDURES:  Critical Care performed: Yes, see critical care procedure note(s)  .Critical Care  Performed by: Arta Silence, MD Authorized by: Arta Silence, MD   Critical care provider statement:    Critical care  time (minutes):  30   Critical care was necessary to treat or prevent imminent or life-threatening deterioration of the following conditions:  Cardiac failure   Critical care was time spent personally by me on the following activities:  Development of treatment plan with patient or surrogate, discussions with consultants, evaluation of patient's response to treatment, examination of patient, ordering and review of laboratory studies, ordering and review of radiographic studies, ordering and performing treatments and interventions, pulse oximetry, re-evaluation of patient's condition, review of old charts and obtaining history from patient or surrogate    MEDICATIONS ORDERED IN ED: Medications  ketorolac (TORADOL) 30 MG/ML injection 30 mg (has no administration in time range)     IMPRESSION / MDM / ASSESSMENT AND PLAN / ED COURSE  I reviewed the triage vital signs and the nursing  notes.  37 year old male with PMH as noted above presents with chest pain since 1 AM.  EMS EKG showed anterior ST elevations concerning for ST elevation MI.  I reviewed the EKG during transport and agreed with activation of code STEMI by EMS.  The physical exam is unremarkable.  EKG performed here shows similar anterior ST elevations with possible borderline inferior reciprocal changes.  However morphology is overall similar to prior EKG from 2019.  Differential diagnosis includes, but is not limited to, acute MI, musculoskeletal chest wall pain, myocarditis, pericarditis, GERD.  Code STEMI was activated and Dr. Saralyn Pilar came to the ED to see the patient.  I consulted and discussed the patient with him.  The EKG findings are most consistent with early repolarization although MI or myocarditis are possible.  Dr. Saralyn Pilar canceled the code STEMI and does not recommend emergent catheterization.  He recommends giving NSAIDs, checking cardiac enzymes, and then we will reassess.  Patient's presentation is most consistent with acute presentation with potential threat to life or bodily function.  The patient is on the cardiac monitor to evaluate for evidence of arrhythmia and/or significant heart rate changes.   ----------------------------------------- 7:07 AM on 01/27/2022 -----------------------------------------  CBC shows normal hemoglobin.  INR is normal.  Troponin is pending.  I have signed the patient out to the oncoming ED physician Dr. Cheri Fowler.    FINAL CLINICAL IMPRESSION(S) / ED DIAGNOSES   Final diagnoses:  Chest pain, unspecified type     Rx / DC Orders   ED Discharge Orders     None        Note:  This document was prepared using Dragon voice recognition software and may include unintentional dictation errors.    Arta Silence, MD 01/27/22 [phone]

## 2022-01-27 NOTE — Progress Notes (Signed)
   01/27/22 0700  Spiritual Encounters  Type of Visit Initial  Care provided to: Patient  Referral source Other (comment);Code page  Reason for visit Code  OnCall Visit Yes   Chaplain responded to code stemi. No family present. Chaplain provided compassionate presence and empathetic listening. Chaplain available for follow up as needed.

## 2022-01-27 NOTE — Progress Notes (Addendum)
Called for possible STEMI.  The patient is a 37 year old gentleman without significant cardiovascular risk factors, presents to [facility] ED with 5 hours of chest pain since 1 AM.  Patient describes chest tightness, in a focal area on the left side of his chest, "crescent shaped", which is worse when laying flat, improved after sitting up, with a positional component, worse when reaching out with his left arm.  Patient reports he was playing basketball yesterday.  Patient has had recurrent episodes of similar chest discomfort for over a year.  ECG reveals sinus rhythm at 61 bpm, with up-scooping ST elevation primarily in leads V2, I and aVL which look very similar to prior ECG on 08/29/2017.  I do not believe ECG meets STEMI criteria, and the patient's clinical presentation is atypical.  Code STEMI canceled at this time.

## 2022-02-02 ENCOUNTER — Ambulatory Visit: Payer: BC Managed Care – PPO | Admitting: Gerontology

## 2022-02-02 ENCOUNTER — Other Ambulatory Visit: Payer: Self-pay

## 2022-02-02 ENCOUNTER — Encounter: Payer: Self-pay | Admitting: Gerontology

## 2022-02-02 VITALS — BP 137/89 | HR 71 | Temp 97.6°F | Resp 16 | Ht 72.0 in | Wt 197.1 lb

## 2022-02-02 DIAGNOSIS — Z7689 Persons encountering health services in other specified circumstances: Secondary | ICD-10-CM

## 2022-02-02 DIAGNOSIS — Z8659 Personal history of other mental and behavioral disorders: Secondary | ICD-10-CM

## 2022-02-02 NOTE — Progress Notes (Deleted)
New Patient Office Visit  Subjective    Patient ID: Sean King, male    DOB: [birth year]  Age: 37 y.o. MRN: [id number]  CC:  Chief Complaint  Patient presents with   Establish Care   Anxiety    HPI Burton Gahan presents to establish care with a history of PTSD, anxiety and depression, sleep disturbance, and migraines. ***  Outpatient Encounter Medications as of 02/02/2022  Medication Sig   hydrOXYzine (ATARAX) 25 MG tablet Take 1 tablet (25 mg total) by mouth 3 (three) times daily as needed. (Patient not taking: Reported on 08/30/2021)   traZODone (DESYREL) 50 MG tablet Take 1 tablet (50 mg total) by mouth at bedtime.   No facility-administered encounter medications on file as of 02/02/2022.    Past Medical History:  Diagnosis Date   Migraines    PTSD (post-traumatic stress disorder)    Scoliosis     No past surgical history on file.  No family history on file.  Social History   Socioeconomic History   Marital status: Divorced    Spouse name: Not on file   Number of children: Not on file   Years of education: Not on file   Highest education level: Not on file  Occupational History   Not on file  Tobacco Use   Smoking status: Never   Smokeless tobacco: Never  Vaping Use   Vaping Use: Never used  Substance and Sexual Activity   Alcohol use: Yes    Alcohol/week: 2.0 standard drinks of alcohol    Types: 2 Shots of liquor per week    Comment: twice a month   Drug use: Not Currently    Types: Marijuana    Comment: 02/2021   Sexual activity: Yes    Partners: Female    Birth control/protection: Condom  Other Topics Concern   Not on file  Social History Narrative   Not on file   Social Determinants of Health   Financial Resource Strain: Not on file  Food Insecurity: Not on file  Transportation Needs: Not on file  Physical Activity: Not on file  Stress: Not on file  Social Connections: Not on file  Intimate Partner Violence: Not At Risk (06/10/2021)    Humiliation, Afraid, Rape, and Kick questionnaire    Fear of Current or Ex-Partner: No    Emotionally Abused: No    Physically Abused: No    Sexually Abused: No    ROS      Objective    BP 137/89 (BP Location: Right Arm, Patient Position: Sitting, Cuff Size: Large)   Pulse 71   Temp 97.6 F (36.4 C) (Oral)   Resp 16   Ht 6' (1.829 m)   Wt 197 lb 1.6 oz (89.4 kg)   SpO2 98%   BMI 26.73 kg/m   Physical Exam  {Labs (Optional):23779}    Assessment & Plan:   Problem List Items Addressed This Visit   None   No follow-ups on file.   Lorre Munroe, RN

## 2022-02-02 NOTE — Progress Notes (Signed)
New Patient Office Visit  Subjective    Patient ID: Sean King, male    DOB: 1985/06/21  Age: 37 y.o. MRN: [id number]  CC:  Chief Complaint  Patient presents with   Establish Care   Anxiety    HPI Sean King Sean King is a 37 y.o. male with PTSD, anxiety, and migraines who presents to establish care. He reports history of anxiety and was prescribed  hydroxyzine 25 mg tid, and Trazodone 50 mg at bedtime, but he reports taking medications as needed.  He states that his mood is good,denies suicidal nor homicidal ideation. He was seen at the ED on 01/27/22 for chest pain, his labs were wnl, chest x ray showed no radiographic evidence of acute cardiopulmonary disease, EKG showed lateral infarcts with acute borderline ST elevation. Currently, he denies chest pain, palpitation, and shortness of breath. Overall, he states that he's doing well and offers no further complaint.   Outpatient Encounter Medications as of 02/02/2022  Medication Sig   hydrOXYzine (ATARAX) 25 MG tablet Take 1 tablet (25 mg total) by mouth 3 (three) times daily as needed.   traZODone (DESYREL) 50 MG tablet Take 1 tablet (50 mg total) by mouth at bedtime.   No facility-administered encounter medications on file as of 02/02/2022.    Past Medical History:  Diagnosis Date   Migraines    PTSD (post-traumatic stress disorder)    Scoliosis     Past Surgical History:  Procedure Laterality Date   NO PAST SURGERIES      Family History  Problem Relation Age of Onset   Other Mother        unknown medical history   Other Father        unknown medical history   Other Maternal Grandmother        unknown medical history   Other Maternal Grandfather        unknown medical history   Other Paternal Grandmother        unknown medical history   Other Paternal Grandfather        unknown medical history    Social History   Socioeconomic History   Marital status: Divorced    Spouse name: Not on file    Number of children: Not on file   Years of education: Not on file   Highest education level: Not on file  Occupational History   Not on file  Tobacco Use   Smoking status: Never   Smokeless tobacco: Never   Tobacco comments:    Patient is homeless and living at [employer] Use   Vaping Use: Never used  Substance and Sexual Activity   Alcohol use: Yes    Alcohol/week: 2.0 standard drinks of alcohol    Types: 2 Shots of liquor per week    Comment: twice a month, last use >6 months ago   Drug use: Not Currently    Types: Marijuana    Comment: last use 02/2021   Sexual activity: Yes    Partners: Female    Birth control/protection: Condom  Other Topics Concern   Not on file  Social History Narrative   Not on file   Social Determinants of Health   Financial Resource Strain: Not on file  Food Insecurity: Unknown (02/02/2022)   Hunger Vital Sign    Worried About Running Out of Food in the Last Year: Never true    Ran Out of Food in the Last Year: Not on file  Transportation Needs: Unmet Transportation Needs (02/02/2022)   PRAPARE - Hydrologist (Medical): Yes    Lack of Transportation (Non-Medical): Yes  Physical Activity: Not on file  Stress: Not on file  Social Connections: Not on file  Intimate Partner Violence: Not At Risk (02/02/2022)   Humiliation, Afraid, Rape, and Kick questionnaire    Fear of Current or Ex-Partner: No    Emotionally Abused: No    Physically Abused: No    Sexually Abused: No    Review of Systems  Constitutional: Negative.   HENT: Negative.    Eyes: Negative.   Respiratory: Negative.    Cardiovascular: Negative.   Gastrointestinal: Negative.   Genitourinary: Negative.   Musculoskeletal: Negative.   Skin: Negative.   Neurological: Negative.   Endo/Heme/Allergies: Negative.   Psychiatric/Behavioral:  The patient is nervous/anxious.         Objective    BP 137/89 (BP Location: Right Arm,  Patient Position: Sitting, Cuff Size: Large)   Pulse 71   Temp 97.6 F (36.4 C) (Oral)   Resp 16   Ht 6' (1.829 m)   Wt 197 lb 1.6 oz (89.4 kg)   SpO2 98%   BMI 26.73 kg/m   Physical Exam Constitutional:      Appearance: Normal appearance.  HENT:     Head: Normocephalic and atraumatic.     Nose: Nose normal.     Mouth/Throat:     Mouth: Mucous membranes are moist.  Eyes:     Pupils: Pupils are equal, round, and reactive to light.  Cardiovascular:     Rate and Rhythm: Normal rate and regular rhythm.     Pulses: Normal pulses.     Heart sounds: Normal heart sounds.  Pulmonary:     Effort: Pulmonary effort is normal.     Breath sounds: Normal breath sounds.  Abdominal:     General: Abdomen is flat. Bowel sounds are normal.     Palpations: Abdomen is soft.  Genitourinary:    Comments: Deferred per patient Musculoskeletal:        General: Normal range of motion.     Cervical back: Normal range of motion.  Skin:    General: Skin is warm.  Neurological:     General: No focal deficit present.     Mental Status: He is alert and oriented to person, place, and time. Mental status is at baseline.  Psychiatric:        Attention and Perception: Attention normal.        Mood and Affect: Mood and affect normal.        Speech: Speech normal.        Behavior: Behavior normal. Behavior is cooperative.        Thought Content: Thought content normal.         Assessment & Plan:   1. Encounter to establish care - He was encouraged to continue his ED discharge instructions and notify clinic and go back to the ED for worsening symptoms.  2. History of anxiety - He will follow up with [facility] behavioral health, was advised to call the Crisis hotline for worsening symptoms.   Return in about 1 month (around 03/03/2022), or if symptoms worsen or fail to improve.   Theresa Dohrman Jerold Coombe, NP

## 2022-02-15 ENCOUNTER — Encounter: Payer: Self-pay | Admitting: Licensed Clinical Social Worker

## 2022-02-15 ENCOUNTER — Ambulatory Visit: Payer: Self-pay | Admitting: Licensed Clinical Social Worker

## 2022-02-15 DIAGNOSIS — Z7689 Persons encountering health services in other specified circumstances: Secondary | ICD-10-CM

## 2022-02-15 NOTE — BH Specialist Note (Signed)
ADULT Comprehensive Clinical Assessment (CCA) Note   02/15/2022 Laurette Schimke [id number]   Referring Provider: Carlyon Shadow, NP Session Start time: No data recorded   Session End time: No data recorded Total time in minutes: No data recorded  SUBJECTIVE: Sean King is a 37 y.o.   male accompanied by  himself  Sean King was seen in consultation at the request of [place]loabachie, Chioma E, NP for evaluation of  mental health .  Types of Service: Comprehensive Clinical Assessment (CCA)  Reason for referral in patient/family's own words:  The patient stated, " I have problems I would like to work on."    He likes to be called Sean King.  He came to the appointment with  himself .  Primary language at home is [place].  Constitutional Appearance: cooperative, well-nourished, well-developed, alert and well-appearing  (Patient to answer as appropriate) Gender identity: male Sex assigned at birth: male Pronouns: he    Mental status exam:   General Appearance /Behavior:  Casual Eye Contact:  Fair Motor Behavior:  Normal Speech:  Normal Level of Consciousness:  Alert Mood:  Irritable Affect:  Blunt Anxiety Level:  Minimal Thought Process:  Coherent Thought Content:  Paranoid Ideation Perception:  Hallucinations Judgment:  Fair Insight:  Present   Current Medications and therapies: He is taking:   Outpatient Encounter Medications as of 02/15/2022  Medication Sig   hydrOXYzine (ATARAX) 25 MG tablet Take 1 tablet (25 mg total) by mouth 3 (three) times daily as needed.   traZODone (DESYREL) 50 MG tablet Take 1 tablet (50 mg total) by mouth at bedtime.   No facility-administered encounter medications on file as of 02/15/2022.     Therapies:  Behavioral therapy  Family history: Family mental illness:   The patient declined to answer Family school achievement history:  No known history of autism, learning disability, intellectual disability Other relevant family history:    The patient declined to answer  Social History: Now living with  The patient declined to answer . Employment:  Not employed Religious or Spiritual Beliefs: The patient declined to answer   Negative Mood Concerns He does not make negative statements about self. Self-injury:  No Suicidal ideation:  No Suicide attempt:  No  Additional Anxiety Concerns: Panic attacks:  Yes-The patient declined to elaborate Obsessions:  No Compulsions:  No  Stressors:  Family conflict, Finances, Housing/homelessness, Job loss/unemployment, Peer relationships, and Recent diagnosis of chronic illness or psychiatric disorder  Alcohol and/or Substance Use: Have you recently consumed alcohol? no  Have you recently used any drugs?  no  Have you recently consumed any tobacco? no Does patient seem concerned about dependence or abuse of any substance? no  Substance Use Disorder Checklist:  N/A  Severity Risk Scoring based on DSM-5 Criteria for Substance Use Disorder. The presence of at least two (2) criteria in the last 12 months indicate a substance use disorder. The severity of the substance use disorder is defined as:  Mild: Presence of 2-3 criteria Moderate: Presence of 4-5 criteria Severe: Presence of 6 or more criteria  Traumatic Experiences: History or current traumatic events (natural disaster, house fire, etc.)? no History or current physical trauma?  no History or current emotional trauma?  no History or current sexual trauma?  no History or current domestic or intimate partner violence?  no History of bullying:  no  Risk Assessment: Suicidal or homicidal thoughts?   no Self injurious behaviors?  no Guns in the home?  no  Self Harm Risk Factors:  Loss (financial/interpersonal/professional), Social withdrawal/isolation, and Unemployment  Self Harm Thoughts?: No  Patient and/or Family's Strengths/Protective Factors: Concrete supports in place (healthy food, safe environments, etc.)  and Physical Health (exercise, healthy diet, medication compliance, etc.)  Patient's and/or Family's Goals in their own words: The patient declined to answer  Interventions: Interventions utilized:  Supportive Reflection   Patient and/or Family Response: The patient declined to answer  Standardized Assessments completed: Patient declined screening  Patient Centered Plan: Patient is on the following Treatment Plan(s):  TBD  Coordination of Care: Coordination of care with Julian Hy, LCSW, Dr. Octavia Heir, M.D. Psychiatric Consultant, and Carlyon Shadow, NP.     Recommendations for Services/Supports/Treatments: Per case consultation with Dr. Royston Bake, MD, psychiatric consultant and Carlyon Shadow, NP it is recommended the patient be referred to [facility] for a higher level of care.   Progress towards Goals: Other  Treatment Plan Summary: Behavioral Health Clinician will: Assess individual's status and evaluate for psychiatric symptoms  Individual will: Report any thoughts or plans of harming themselves or others  Referral(s): [place] Saidee Geremia, LCSWA

## 2022-03-03 ENCOUNTER — Ambulatory Visit: Payer: Self-pay | Admitting: Gerontology

## 2022-03-03 ENCOUNTER — Institutional Professional Consult (permissible substitution): Payer: No Payment, Other | Admitting: Licensed Clinical Social Worker

## 2022-03-24 NOTE — Progress Notes (Signed)
This encounter was created in error - please disregard.

## 2022-04-18 ENCOUNTER — Encounter: Payer: Self-pay | Admitting: *Deleted

## 2023-02-21 NOTE — Congregational Nurse Program (Signed)
Client to RN office wanting assistance in a medical release form to provide medical information to DSS. RN helped with giving waiver that I will scan into EPIC for him.  Additionally client requesting spiritual care. RN was able to empathize with needs and suffering and speak life over client. RN provided therapeutic communication, and prayer.

## 2023-02-22 NOTE — Congregational Nurse Program (Signed)
  Dept: [phone]   Congregational Nurse Program Note  Date of Encounter: 02/22/2023  Past Medical History: Past Medical History:  Diagnosis Date   Migraines    PTSD (post-traumatic stress disorder)    Scoliosis     Encounter Details:  Community Questionnaire - 02/22/23 1230       Questionnaire   Ask client: Do you give verbal consent for me to treat you today? Yes    Student Assistance N/A    Location Patient Served  [facility]    Encounter Setting CN site    Population Status Unhoused    Insurance Unknown    Insurance/Financial Assistance Referral N/A    Medication N/A    Medical Provider Yes    Screening Referrals Made N/A    Medical Referrals Made N/A    Medical Appointment Completed N/A    CNP Interventions Spiritual Care;Counsel;Educate    Screenings CN Performed N/A    ED Visit Averted N/A    Life-Saving Intervention Made N/A            Client in need of spiritual care he endorses that he has been dealing with extreme guilt, shame over the last three years of a memory that he doesn't fully remember but carries the shame emotions from it. RN offering spiritual guidance, prayed and used scripture to remind him about allowing others to carry burdens with him and learning to be open to therapy again to process emotions, he talked about he felt his spirit has been tormented and he was encouraged to read more about Reita Cliche life to remind him he is not alone in his suffering, we talked about speaking God's word over the negative thoughts that sometimes plague him, and Index cards given for him to write scripture that will help ground him when he is feeling an attack by the enemy, RN also talked to client about breath prayers for when his mind races at night to start calming his mind. He is curious also about what it fully looks like to surrender to God. He was reminded of God's grace, mercy and forgiveness. And the art of showing self compassion to himself when he is  suffering. RN educated, provided therapeutic communication, spiritual encouragement and empathetic listening without judgement and client is appreciative and grateful for conversation.

## 2023-02-27 NOTE — Congregational Nurse Program (Signed)
  Dept: [phone]   Congregational Nurse Program Note  Date of Encounter: 02/27/2023  Past Medical History: Past Medical History:  Diagnosis Date   Migraines    PTSD (post-traumatic stress disorder)    Scoliosis     Encounter Details:  Community Questionnaire - 02/27/23 1013       Questionnaire   Ask client: Do you give verbal consent for me to treat you today? Yes    Student Assistance N/A    Location Patient Served  [facility]    Encounter Setting CN site    Population Status Unhoused    Insurance Unknown    Insurance/Financial Assistance Referral N/A    Medication N/A    Medical Provider Yes    Screening Referrals Made N/A    Medical Referrals Made N/A    Medical Appointment Completed N/A    CNP Interventions Advocate/Support;Navigate Healthcare System;Case Management;Spiritual Care;Educate;Counsel    Screenings CN Performed N/A    ED Visit Averted N/A    Life-Saving Intervention Made N/A            Mr. Nill in need of spiritual care and help with Medical records for his social security appointment.

## 2023-03-06 NOTE — Congregational Nurse Program (Signed)
 Client to RN office. He has successfully been able to get his Social security card back and is feeling relief that he is getting his identity back after losing all identification. We arranged for him to get a PCP. Client also wanting spiritual care and guidance. RN offered prayer and ministered on how the enemy attacks our minds with thoughts, fears and doubt. He was ministered on what it means to recognize God's voice which brings clarity and peace. And the voice of the enemy talks at [place] and condemns [place], shames [place] and makes [place] feel guilty over things we've done. I reminded him of God grace and mercy that he offers every single day and how we can renew our minds on [address] with [employer]. So that we can become warriors for [employer]. Charity fundraiser gave him some biblical material to help him understand this mindset within [place]. We will continue having our spiritual care conversations.

## 2023-03-20 NOTE — Congregational Nurse Program (Signed)
 RN brought client to office he looks upset and disheveled. RN spoke to client and he expressed frustration over the difficulties he is facing trying to accomplish task to work on getting his identity back and the complicated processes he faces of having to get to the places he needs to be. It is overwhelming. RN offered a safe space for client to just release his frustrations and calm down. RN then offered spiritual encouragement, therapeutic touch and communication, active and empathetic listening. Client expressed gratitude towards RN to offer care and support during his uncomfortable and frustrating feelings. Will continue supporting and caring for this client.

## 2023-04-03 NOTE — Congregational Nurse Program (Signed)
  Dept: [phone]   Congregational Nurse Program Note  Date of Encounter: 04/03/2023  Past Medical History: Past Medical History:  Diagnosis Date   Migraines    PTSD (post-traumatic stress disorder)    Scoliosis     Encounter Details:  Community Questionnaire - 04/03/23 1315       Questionnaire   Ask client: Do you give verbal consent for me to treat you today? Yes    Student Assistance N/A    Location Patient Served  [facility]    Encounter Setting CN site    Population Status Unhoused    Insurance Unknown    Insurance/Financial Assistance Referral N/A    Medication N/A    Medical Provider Yes    Screening Referrals Made N/A    Medical Referrals Made N/A    Medical Appointment Completed N/A    CNP Interventions Advocate/Support;Navigate Healthcare System;Case Management;Spiritual Care;Educate;Counsel    Screenings CN Performed N/A    ED Visit Averted N/A    Life-Saving Intervention Made Yes             RN spoke to client. He is doing well. RN was able to continue spiritual care, encouragement and support. As well as actively listening to his concerns in finding a new job. RN also sent a job referral to [employer] to assist client.

## 2023-04-24 NOTE — Congregational Nurse Program (Signed)
 Client to RN office briefly for assistance on getting help with housing. RN will refer him to [facility] case workers who can help with application to get into housing with [facility], doorway housing project and rapid rehousing. RN was given referral form and will send to manager to follow up with client.

## 2023-05-01 NOTE — Congregational Nurse Program (Signed)
 Client to RN office for assistance with housing. RN assisted in sending referral to [facility] case workers. Client also has been dealing with many frustrating events in his life and feels loss and without direction. RN able to provide spiritual care, read scripture and help comfort client. RN gave journal for client to write his feelings and observations and to work towards getting a mental health therapist that can help sort through these feelings and help him with coping and resilience.

## 2023-05-05 ENCOUNTER — Ambulatory Visit (HOSPITAL_COMMUNITY)
Admission: EM | Admit: 2023-05-05 | Discharge: 2023-05-07 | Disposition: A | Discharge status: Other Acute Inpatient Hospital | Attending: Psychiatry | Admitting: Psychiatry

## 2023-05-05 DIAGNOSIS — R45851 Suicidal ideations: Secondary | ICD-10-CM | POA: Insufficient documentation

## 2023-05-05 DIAGNOSIS — F17290 Nicotine dependence, other tobacco product, uncomplicated: Secondary | ICD-10-CM | POA: Insufficient documentation

## 2023-05-05 DIAGNOSIS — F4312 Post-traumatic stress disorder, chronic: Secondary | ICD-10-CM | POA: Insufficient documentation

## 2023-05-05 DIAGNOSIS — F17210 Nicotine dependence, cigarettes, uncomplicated: Secondary | ICD-10-CM | POA: Insufficient documentation

## 2023-05-05 DIAGNOSIS — Z5902 Unsheltered homelessness: Secondary | ICD-10-CM | POA: Insufficient documentation

## 2023-05-05 DIAGNOSIS — F4323 Adjustment disorder with mixed anxiety and depressed mood: Secondary | ICD-10-CM | POA: Insufficient documentation

## 2023-05-05 DIAGNOSIS — F411 Generalized anxiety disorder: Secondary | ICD-10-CM | POA: Insufficient documentation

## 2023-05-05 DIAGNOSIS — F29 Unspecified psychosis not due to a substance or known physiological condition: Secondary | ICD-10-CM | POA: Insufficient documentation

## 2023-05-05 DIAGNOSIS — F333 Major depressive disorder, recurrent, severe with psychotic symptoms: Secondary | ICD-10-CM | POA: Insufficient documentation

## 2023-05-06 ENCOUNTER — Other Ambulatory Visit: Payer: Self-pay

## 2023-05-06 DIAGNOSIS — R45851 Suicidal ideations: Secondary | ICD-10-CM

## 2023-05-06 LAB — COMPREHENSIVE METABOLIC PANEL WITH GFR
ALT: 31 U/L (ref 0–44)
AST: 21 U/L (ref 15–41)
Albumin: 3.6 g/dL (ref 3.5–5.0)
Alkaline Phosphatase: 73 U/L (ref 38–126)
Anion gap: 7 (ref 5–15)
BUN: 18 mg/dL (ref 6–20)
CO2: 25 mmol/L (ref 22–32)
Calcium: 8.9 mg/dL (ref 8.9–10.3)
Chloride: 106 mmol/L (ref 98–111)
Creatinine, Ser: 1.11 mg/dL (ref 0.61–1.24)
GFR, Estimated: >60 mL/min (ref >60)
Glucose, Bld: 82 mg/dL (ref 70–99)
Potassium: 3.9 mmol/L (ref 3.5–5.1)
Sodium: 138 mmol/L (ref 135–145)
Total Bilirubin: 0.7 mg/dL (ref 0.0–1.2)
Total Protein: 6.6 g/dL (ref 6.5–8.1)

## 2023-05-06 LAB — CBC WITH DIFFERENTIAL/PLATELET
Abs Immature Granulocytes: 0.01 10*3/uL (ref 0.00–0.07)
Basophils Absolute: 0 10*3/uL (ref 0.0–0.1)
Basophils Relative: 1 %
Eosinophils Absolute: 0.1 10*3/uL (ref 0.0–0.5)
Eosinophils Relative: 3 %
HCT: 41.6 % (ref 39.0–52.0)
Hemoglobin: 13.5 g/dL (ref 13.0–17.0)
Immature Granulocytes: 0 %
Lymphocytes Relative: 49 %
Lymphs Abs: 1.4 10*3/uL (ref 0.7–4.0)
MCH: 24.3 pg — ABNORMAL LOW (ref 26.0–34.0)
MCHC: 32.5 g/dL (ref 30.0–36.0)
MCV: 75 fL — ABNORMAL LOW (ref 80.0–100.0)
Monocytes Absolute: 0.3 10*3/uL (ref 0.1–1.0)
Monocytes Relative: 11 %
Neutro Abs: 1 10*3/uL — ABNORMAL LOW (ref 1.7–7.7)
Neutrophils Relative %: 36 %
Platelets: 162 10*3/uL (ref 150–400)
RBC: 5.55 MIL/uL (ref 4.22–5.81)
RDW: 14 % (ref 11.5–15.5)
WBC: 2.9 10*3/uL — ABNORMAL LOW (ref 4.0–10.5)
nRBC: 0 % (ref 0.0–0.2)

## 2023-05-06 LAB — POCT URINE DRUG SCREEN - MANUAL ENTRY (I-SCREEN)
POC Amphetamine UR: NOT DETECTED
POC Buprenorphine (BUP): NOT DETECTED
POC Cocaine UR: NOT DETECTED
POC Marijuana UR: POSITIVE — AB
POC Methadone UR: NOT DETECTED
POC Methamphetamine UR: NOT DETECTED
POC Morphine: NOT DETECTED
POC Oxazepam (BZO): NOT DETECTED
POC Oxycodone UR: NOT DETECTED
POC Secobarbital (BAR): NOT DETECTED

## 2023-05-06 LAB — ETHANOL: Alcohol, Ethyl (B): <15 mg/dL (ref <15)

## 2023-05-06 LAB — POC SARS CORONAVIRUS 2 AG: SARSCOV2ONAVIRUS 2 AG: NEGATIVE

## 2023-05-06 MED ORDER — ALUM & MAG HYDROXIDE-SIMETH 200-200-20 MG/5ML PO SUSP: 30.0000 mL | ORAL | Status: DC | PRN

## 2023-05-06 MED ORDER — DIPHENHYDRAMINE HCL 50 MG PO CAPS: 50.0000 mg | ORAL_CAPSULE | Freq: Three times a day (TID) | ORAL | Status: DC | PRN

## 2023-05-06 MED ORDER — NICOTINE 21 MG/24HR TD PT24
21.0000 mg | MEDICATED_PATCH | Freq: Every day | TRANSDERMAL | Status: DC
  Filled 2023-05-06: qty 1

## 2023-05-06 MED ORDER — RISPERIDONE 0.5 MG PO TABS
0.5000 mg | ORAL_TABLET | Freq: Every day | ORAL | Status: DC
  Administered 2023-05-06: 0.5 mg via ORAL
  Filled 2023-05-06: qty 1

## 2023-05-06 MED ORDER — ACETAMINOPHEN 325 MG PO TABS: 650.0000 mg | ORAL_TABLET | Freq: Four times a day (QID) | ORAL | Status: DC | PRN

## 2023-05-06 MED ORDER — RISPERIDONE 1 MG PO TABS
1.0000 mg | ORAL_TABLET | Freq: Every day | ORAL | Status: DC
  Administered 2023-05-06: 1 mg via ORAL
  Filled 2023-05-06: qty 1

## 2023-05-06 MED ORDER — MAGNESIUM HYDROXIDE 400 MG/5ML PO SUSP: 30.0000 mL | Freq: Every day | ORAL | Status: DC | PRN

## 2023-05-06 MED ORDER — HALOPERIDOL LACTATE 5 MG/ML IJ SOLN: 10.0000 mg | Freq: Three times a day (TID) | INTRAMUSCULAR | Status: DC | PRN

## 2023-05-06 MED ORDER — DIPHENHYDRAMINE HCL 50 MG/ML IJ SOLN: 50.0000 mg | Freq: Three times a day (TID) | INTRAMUSCULAR | Status: DC | PRN

## 2023-05-06 MED ORDER — HYDROXYZINE HCL 25 MG PO TABS: 25.0000 mg | ORAL_TABLET | Freq: Three times a day (TID) | ORAL | Status: DC | PRN

## 2023-05-06 MED ORDER — HALOPERIDOL LACTATE 5 MG/ML IJ SOLN: 5.0000 mg | Freq: Three times a day (TID) | INTRAMUSCULAR | Status: DC | PRN

## 2023-05-06 MED ORDER — LORAZEPAM 2 MG/ML IJ SOLN: 2.0000 mg | Freq: Three times a day (TID) | INTRAMUSCULAR | Status: DC | PRN

## 2023-05-06 MED ORDER — HALOPERIDOL 5 MG PO TABS: 5.0000 mg | ORAL_TABLET | Freq: Three times a day (TID) | ORAL | Status: DC | PRN

## 2023-05-06 NOTE — ED Notes (Signed)
 Attempted to assess pt. He states he hasn't been up long enough to have any thoughts. States he is drowsy from sleep medication given last night. Will attempt to reassess when patient is more alert.
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
## 2023-05-06 NOTE — ED Provider Notes (Signed)
 [facility] Urgent Care Continuous Assessment Admission H&P  Date: 05/06/23 Patient Name: Sean King MRN: [id number] Chief Complaint: hallucinations  Diagnoses:  Final diagnoses:  Suicidal ideation  Psychosis, unspecified psychosis type [facility])    HPI: Sean King is a 38 y/o male with a psychiatric history of PTSD, GAD and AVH presenting to [facility] as a walk in unaccompanied voluntarily with complaints of auditory hallucinations and suicidal ideations.  Patient seen face to face by this provider and chart reviewed on 05/06/23.  On evaluation Sean King reports that he has been having suicidal ideation for a few months.  Patient also endorses auditory hallucinations stating he hears voices that are  like noise that give him commands.  When asked about what commands he has been given patient states is hard to explain.  Patient reports that he uses marijuana about 4 times a month and also uses cocaine but states is not often.  Patient states he also vapes nicotine.  Patient states he is not currently on any medications but does have a therapist but he does not remember the name of his therapist and he has not seen his therapist in a few weeks. Patient reports that he started experiencing mental health symptoms in his 30s after his divorce.  Patient reports he was previously prescribed trazodone , Remeron and olanzapine but those medications made him feel strange and he stopped taking them.  During evaluation Sean King is sitting in the assessment room in no acute distress.  He is alert, oriented x 4, calm, cooperative and attentive.  His mood is euthymic with congruent affect. He has normal speech, and behavior.  Objectively there is no evidence of psychosis/mania or delusional thinking.  Patient is able to converse coherently, with goal directed thoughts, no distractibility, or pre-occupation. He endorses suicidal ideation and auditory hallucinations. Patient denies self-harm/homicidal ideation,, and  paranoia.  Patient answered question appropriately.    Patient will be admitted to [facility] continuous observation for crisis management, safety and stabilization.  Total Time spent with patient: 20 minutes  Musculoskeletal  Strength & Muscle Tone: within normal limits Gait & Station: normal Patient leans: N/A  Psychiatric Specialty Exam  Presentation General Appearance:  Casual  Eye Contact: Fair  Speech: Clear and Coherent  Speech Volume: Normal  Handedness: Right   Mood and Affect  Mood: Euthymic  Affect: Appropriate   Thought Process  Thought Processes: Coherent  Descriptions of Associations:Intact  Orientation:Full (Time, Place and Person)  Thought Content:WDL  Diagnosis of Schizophrenia or Schizoaffective disorder in past: No  Duration of Psychotic Symptoms: N/A  Hallucinations:Hallucinations: Auditory Description of Auditory Hallucinations: hearing noises  Ideas of Reference:None  Suicidal Thoughts:Suicidal Thoughts: Yes, Passive SI Passive Intent and/or Plan: Without Intent; Without Plan  Homicidal Thoughts:Homicidal Thoughts: No   Sensorium  Memory: Immediate Good; Recent Good; Remote Good  Judgment: Fair  Insight: Fair   Chartered certified accountant: Fair  Attention Span: Fair  Recall: [employer] of Knowledge: Fair  Language: Fair   Psychomotor Activity  Psychomotor Activity: Psychomotor Activity: Normal   Assets  Assets: Manufacturing systems engineer; Desire for Improvement; Housing; Physical Health; Resilience   Sleep  Sleep: Sleep: Fair Number of Hours of Sleep: 6   Nutritional Assessment (For OBS and FBC admissions only) Has the patient had a weight loss or gain of 10 pounds or more in the last 3 months?: No Has the patient had a decrease in food intake/or appetite?: No Does the patient have dental problems?: No Does the  patient have eating habits or behaviors that may be indicators of an eating  disorder including binging or inducing vomiting?: No Has the patient recently lost weight without trying?: 0 Has the patient been eating poorly because of a decreased appetite?: 0 Malnutrition Screening Tool Score: 0    Physical Exam HENT:     Head: Normocephalic.     Nose: Nose normal.  Eyes:     Pupils: Pupils are equal, round, and reactive to light.  Cardiovascular:     Rate and Rhythm: Normal rate.  Pulmonary:     Effort: Pulmonary effort is normal.  Abdominal:     General: Abdomen is flat.  Musculoskeletal:        General: Normal range of motion.     Cervical back: Normal range of motion.  Skin:    General: Skin is warm.  Neurological:     Mental Status: He is alert and oriented to person, place, and time.  Psychiatric:        Attention and Perception: He perceives auditory hallucinations.        Mood and Affect: Mood normal.        Speech: Speech normal.        Behavior: Behavior is slowed.        Thought Content: Thought content includes suicidal ideation. Thought content does not include suicidal plan.        Cognition and Memory: Cognition normal.        Judgment: Judgment is impulsive.    Review of Systems  Constitutional: Negative.   HENT: Negative.    Eyes: Negative.   Respiratory: Negative.    Cardiovascular: Negative.   Gastrointestinal: Negative.   Genitourinary: Negative.   Musculoskeletal: Negative.   Skin: Negative.   Neurological: Negative.   Endo/Heme/Allergies: Negative.   Psychiatric/Behavioral:  Positive for hallucinations and suicidal ideas.     Blood pressure (!) 147/95, pulse 74, resp. rate 19, SpO2 100%. There is no height or weight on file to calculate BMI.  Past Psychiatric History: allucinations, MDD, PTSD, GAD   Is the patient at risk to self? Yes  Has the patient been a risk to self in the past 6 months? No .    Has the patient been a risk to self within the distant past? Yes   Is the patient a risk to others? No   Has the  patient been a risk to others in the past 6 months? No   Has the patient been a risk to others within the distant past? No   Past Medical History: scoliosis  Family History: Denies any family history   Social History: 38 y/o male homeless with a history of hallucinations, MDD, PTSD, GAD   Last Labs:  No visits with results within 6 Month(s) from this visit.  Latest known visit with results is:  Admission on 01/27/2022, Discharged on 01/27/2022  Component Date Value Ref Range Status   Sodium 01/27/2022 137  135 - 145 mmol/L Final   Potassium 01/27/2022 3.7  3.5 - 5.1 mmol/L Final   Chloride 01/27/2022 105  98 - 111 mmol/L Final   CO2 01/27/2022 25  22 - 32 mmol/L Final   Glucose, Bld 01/27/2022 79  70 - 99 mg/dL Final   Glucose reference range applies only to samples taken after fasting for at least 8 hours.   BUN 01/27/2022 17  6 - 20 mg/dL Final   Creatinine, Ser 01/27/2022 1.17  0.61 - 1.24 mg/dL Final   Calcium  01/27/2022 9.3  8.9 - 10.3 mg/dL Final   Total Protein 16/10/9602 8.1  6.5 - 8.1 g/dL Final   Albumin 54/09/8117 4.5  3.5 - 5.0 g/dL Final   AST 14/78/2956 32  15 - 41 U/L Final   ALT 01/27/2022 19  0 - 44 U/L Final   Alkaline Phosphatase 01/27/2022 64  38 - 126 U/L Final   Total Bilirubin 01/27/2022 1.1  0.3 - 1.2 mg/dL Final   GFR, Estimated 01/27/2022 >60  >60 mL/min Final   Comment: (NOTE) Calculated using the CKD-EPI Creatinine Equation (2021)    Anion gap 01/27/2022 7  5 - 15 Final   Performed at [facility], [address] [place], [place] [address]   WBC 01/27/2022 5.4  4.0 - 10.5 K/uL Final   RBC 01/27/2022 6.13 (H)  4.22 - 5.81 MIL/uL Final   Hemoglobin 01/27/2022 14.5  13.0 - 17.0 g/dL Final   HCT 65/78/4696 46.4  39.0 - 52.0 % Final   MCV 01/27/2022 75.7 (L)  80.0 - 100.0 fL Final   MCH 01/27/2022 23.7 (L)  26.0 - 34.0 pg Final   MCHC 01/27/2022 31.3  30.0 - 36.0 g/dL Final   RDW 29/52/8413 13.7  11.5 - 15.5 % Final   Platelets 01/27/2022  207  150 - 400 K/uL Final   nRBC 01/27/2022 0.0  0.0 - 0.2 % Final   Neutrophils Relative % 01/27/2022 41  % Final   Neutro Abs 01/27/2022 2.2  1.7 - 7.7 K/uL Final   Lymphocytes Relative 01/27/2022 47  % Final   Lymphs Abs 01/27/2022 2.5  0.7 - 4.0 K/uL Final   Monocytes Relative 01/27/2022 10  % Final   Monocytes Absolute 01/27/2022 0.6  0.1 - 1.0 K/uL Final   Eosinophils Relative 01/27/2022 1  % Final   Eosinophils Absolute 01/27/2022 0.1  0.0 - 0.5 K/uL Final   Basophils Relative 01/27/2022 1  % Final   Basophils Absolute 01/27/2022 0.0  0.0 - 0.1 K/uL Final   Immature Granulocytes 01/27/2022 0  % Final   Abs Immature Granulocytes 01/27/2022 0.02  0.00 - 0.07 K/uL Final   Performed at [facility], [address] [place], [place] [address]   Troponin I (High Sensitivity) 01/27/2022 11  <18 ng/L Final   Comment: (NOTE) Elevated high sensitivity troponin I (hsTnI) values and significant  changes across serial measurements may suggest ACS but many other  chronic and acute conditions are known to elevate hsTnI results.  Refer to the "Links" section for chest pain algorithms and additional  guidance. Performed at [facility], [address] [place], [place] [address]    Tricyclic, Ur Screen 01/27/2022 NONE DETECTED  NONE DETECTED Final   Amphetamines, Ur Screen 01/27/2022 NONE DETECTED  NONE DETECTED Final   MDMA (Ecstasy)Ur Screen 01/27/2022 NONE DETECTED  NONE DETECTED Final   Cocaine Metabolite,Ur [place] 01/27/2022 NONE DETECTED  NONE DETECTED Final   Opiate, Ur Screen 01/27/2022 NONE DETECTED  NONE DETECTED Final   Phencyclidine (PCP) Ur S 01/27/2022 NONE DETECTED  NONE DETECTED Final   Cannabinoid 50 Ng, Ur [place] 01/27/2022 NONE DETECTED  NONE DETECTED Final   Barbiturates, Ur Screen 01/27/2022 NONE DETECTED  NONE DETECTED Final   Benzodiazepine, Ur Scrn 01/27/2022 NONE DETECTED  NONE DETECTED Final   Methadone Scn, Ur 01/27/2022 NONE DETECTED  NONE DETECTED  Final   Comment: (NOTE) Tricyclics + metabolites, urine    Cutoff 1000 ng/mL Amphetamines + metabolites, urine  Cutoff 1000 ng/mL  MDMA (Ecstasy), urine              Cutoff 500 ng/mL Cocaine Metabolite, urine          Cutoff 300 ng/mL Opiate + metabolites, urine        Cutoff 300 ng/mL Phencyclidine (PCP), urine         Cutoff 25 ng/mL Cannabinoid, urine                 Cutoff 50 ng/mL Barbiturates + metabolites, urine  Cutoff 200 ng/mL Benzodiazepine, urine              Cutoff 200 ng/mL Methadone, urine                   Cutoff 300 ng/mL  The urine drug screen provides only a preliminary, unconfirmed analytical test result and should not be used for non-medical purposes. Clinical consideration and professional judgment should be applied to any positive drug screen result due to possible interfering substances. A more specific alternate chemical method must be used in order to obtain a confirmed analytical result. Gas chromatography / mass spectrometry (GC/MS) is the preferred confirm                          atory method. Performed at [facility], [address] [place], [place] [address]    Prothrombin Time 01/27/2022 13.3  11.4 - 15.2 seconds Final   INR 01/27/2022 1.0  0.8 - 1.2 Final   Comment: (NOTE) INR goal varies based on device and disease states. Performed at [facility], [address] [place], [place] [address]    Cholesterol 01/27/2022 153  0 - 200 mg/dL Final   Triglycerides 09/81/1914 55  <150 mg/dL Final   HDL 78/29/5621 48  >40 mg/dL Final   Total CHOL/HDL Ratio 01/27/2022 3.2  RATIO Final   VLDL 01/27/2022 11  0 - 40 mg/dL Final   LDL Cholesterol 01/27/2022 94  0 - 99 mg/dL Final   Comment:        Total Cholesterol/HDL:CHD Risk Coronary Heart Disease Risk Table                     Men   Women  1/2 Average Risk   3.4   3.3  Average Risk       5.0   4.4  2 X Average Risk   9.6   7.1  3 X Average Risk  23.4   11.0        Use the  calculated Patient Ratio above and the CHD Risk Table to determine the patient's CHD Risk.        ATP III CLASSIFICATION (LDL):  <100     mg/dL   Optimal  308-657  mg/dL   Near or Above                    Optimal  130-159  mg/dL   Borderline  846-962  mg/dL   High  >952     mg/dL   Very High Performed at [facility], [address] [place], [place] [address]    aPTT 01/27/2022 33  24 - 36 seconds Final   Performed at [facility], [address] [place], [place] [address]   Hgb A1c MFr Bld 01/27/2022 5.0  4.8 - 5.6 % Final   Comment: (NOTE) Pre diabetes:  5.7%-6.4%  Diabetes:              >6.4%  Glycemic control for   <7.0% adults with diabetes    Mean Plasma Glucose 01/27/2022 96.8  mg/dL Final   Performed at [facility], [address], [place], [place] [address]   Troponin I (High Sensitivity) 01/27/2022 7  <18 ng/L Final   Comment: (NOTE) Elevated high sensitivity troponin I (hsTnI) values and significant  changes across serial measurements may suggest ACS but many other  chronic and acute conditions are known to elevate hsTnI results.  Refer to the "Links" section for chest pain algorithms and additional  guidance. Performed at [facility], [address] [place], [place] [address]     Allergies: Neomycin-bacitracin zn-polymyx, Coconut (cocos nucifera), and Hydrocortisone  Medications:  Facility Ordered Medications  Medication   acetaminophen  (TYLENOL ) tablet 650 mg   alum & mag hydroxide-simeth (MAALOX/MYLANTA) 200-200-20 MG/5ML suspension 30 mL   magnesium  hydroxide (MILK OF MAGNESIA) suspension 30 mL   haloperidol (HALDOL) tablet 5 mg   And   diphenhydrAMINE (BENADRYL) capsule 50 mg   haloperidol lactate (HALDOL) injection 5 mg   And   diphenhydrAMINE (BENADRYL) injection 50 mg   And   LORazepam  (ATIVAN ) injection 2 mg   haloperidol lactate (HALDOL) injection 10 mg   And   diphenhydrAMINE (BENADRYL)  injection 50 mg   And   LORazepam  (ATIVAN ) injection 2 mg   hydrOXYzine  (ATARAX ) tablet 25 mg   risperiDONE (RISPERDAL) tablet 0.5 mg   PTA Medications  Medication Sig   hydrOXYzine  (ATARAX ) 25 MG tablet Take 1 tablet (25 mg total) by mouth 3 (three) times daily as needed.   traZODone  (DESYREL ) 50 MG tablet Take 1 tablet (50 mg total) by mouth at bedtime.      Medical Decision Making  Sean King is a 38 y/o male presenting to [facility] as a walk in unaccompanied voluntarily with complaints of auditory hallucinations and suicidal ideations.    Recommendations  Based on my evaluation the patient does not appear to have an emergency medical condition.  Patient will be admitted to [facility] continuous observation for crisis management, safety and stabilization  Sean Morell E Yaritza Leist, NP 05/06/23  6:31 AM

## 2023-05-06 NOTE — ED Notes (Signed)
 Patient A&Ox4. Patient continues to endorse SI will not elaborate on plan or intent. Patient reports AH will not discuss what he is hearing. Patient denies HI and VH. Patient denies any physical complaints when asked. No acute distress noted. Support and encouragement provided. Routine safety checks conducted according to facility protocol. Encouraged patient to notify staff if thoughts of harm toward self or others arise. Patient verbalize understanding and agreement. Will continue to monitor for safety.

## 2023-05-06 NOTE — Progress Notes (Signed)
 B[facility]/BMU LCSW Progress Note   05/06/2023    4:04 PM  Sean King   [id number]   Type of Contact and Topic:  Psychiatric Bed Placement   Pt accepted to [facility]     Patient meets inpatient criteria per Nickola Baron, NP    The attending provider will be Dr. Rayna Calkin  Call report to [phone]  Keri Peat, RN @ [facility] notified.     Pt scheduled  to arrive at [facility] TODAY. If transportation is unavailable, per Clarissa the Intake RN, if patient is unable to be transported within the next few days please contact her at [phone].    Seona Clemenson, MSW, LCSW-A  4:06 PM 05/06/2023

## 2023-05-06 NOTE — Progress Notes (Signed)
 Patient has been denied by [facility] and has been faxed out. Patient meets BH inpatient criteria per Nickola Baron, NP. Patient has been faxed out to the following facilities:   [facility] HealthPending - Request [facility], [place] [phone]--[facility]IO[facility]NGEX[facility] - Request Sent--[address] [place] [phone]--QVZDG-LOVFIE HealthPending - Request Sent--[address] Soyla Duverney [place] [phone]--[facility] Patient PlacementPending - Request Sent--[facility], [phone]--[facility] [place] - Request Sent--[place] [phone]--[facility] Forest[facility]ending - Request Sent--[facility] Josephina Nicks [place] [phone]--[facility]-[place]ending - Request [address], [place] [phone]--YHCWC-BJSEGBTD [facility] - Request [address] [place], [place] [phone]--KKXFG-HWEXH Serafina Damme [facility] - Request [address] Devota Fontan [facility] [phone]--OEUMP-NTI [facility] HealthPending - Request Sent--[address] [place] [phone]--PYKDX-IPJ Ellan Gunner - Request Sent--[address], [phone]--[facility] HealthPending - Request [address], [place] [phone]--HGDJM-EQAST [facility] - Request Sent--[address] [place] [phone]--YJEHU-DJSHFWY [facility] - Request [address] [place], [place] [phone]--OBSJG-GEZMOQHU SpringsPending - Request [address] [place] [phone]--BSWHQ-PRFFMBW [facility] HealthPending - Request [address], [place] [phone]--QTMAU-QJFH [facility] - Request Sent--[address], [place] [phone]--OMBTD-HRCBULA [facility] - Request Sent--[address] Cecillia Cogan [facility] [phone]--[facility] HealthcarePending - Request [address] [place] [phone]--  Phares Brasher, MSW, LCSW-A  1:08 PM 05/06/2023

## 2023-05-06 NOTE — Progress Notes (Signed)
 CSW sent requested BH referral via fax to [facility]. CSW will continue to monitor the patient to secure recommended disposition.    Lolah Coghlan, MSW, LCSW-A  3:18 PM 05/06/2023

## 2023-05-06 NOTE — ED Notes (Signed)
 Patient A&Ox4. Patient endorses SI. Patient denies HI and AVH. Patient oriented to unit. Patient took shower. Meal and beverage given. Patient denies any physical complaints when asked. No acute distress noted. Support and encouragement provided. Routine safety checks conducted according to facility protocol. Encouraged patient to notify staff if thoughts of harm toward self or others arise. Patient verbalize understanding and agreement. Will continue to monitor for safety.
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
## 2023-05-06 NOTE — ED Notes (Signed)
 Patient resting quietly in bed with eyes closed. Respirations equal and unlabored, skin warm and dry, NAD. Routine safety checks conducted according to facility protocol. Will continue to monitor for safety.

## 2023-05-06 NOTE — ED Notes (Signed)
 Patient refused blood drawn. Jadene Maxwell, NP informed and no new orders received .

## 2023-05-06 NOTE — Discharge Instructions (Signed)
 Pt has been ACCEPTED to [facility] for today   Accepting MD is Dr. Lousia Ayafo

## 2023-05-06 NOTE — Progress Notes (Signed)
 CSW spoke with [facility] regarding the disposition for the patient. CSW faxed additional BH information for continued review.    Ovie Eastep, MSW, LCSW-A  2:50 PM 05/06/2023

## 2023-05-06 NOTE — ED Provider Notes (Addendum)
 FBC/OBS ASAP Discharge Summary  Date and Time: 05/06/2023 10:20 AM Name: Sean King MRN:  [id number]  Subjective:  Sean King is a 38 year old male patient with a past psychiatric history documented as chronic PTSD, adjustment disorder with mixed anxiety and depression, sleep disturbance and anxiety who presented to the [facility] behavioral health urgent care voluntary on 05/05/2023 with complaints of auditory hallucinations and suicidal ideations.  On evaluation, patient reports that the suicidal thoughts hasn't stopped. He reports having suicidal thoughts for months. He reports a suicide plan but states, "I am not going to share it."  He reports that he just woke up and is currently not having suicidal thoughts at this moment. He reports 1 suicide attempt years ago by trying to overdose on pills. He denies homicidal ideations. He endorses auditory hallucinations of hearing voices but states that he is unable to describe the voices. He denies visual hallucinations. He reports feeling depressed for a while and describes his depressive symptoms as feeling drained, sadness, hopelessness, worthlessness, thoughts of death and anhedonia. He reports using drugs and states that he uses nicotine, cocaine and marijuana. He reports that he does not use cocaine often and has not used in a while. He is unable to reason for the last time he used cocaine.  He appears to minimize his substance use. He reports rarely drinking alcohol. He denies outpatient psychiatry. He was started on Risperdal 0.5 mg at bedtime and has been tolerating medication without any noticeable side effects.  Diagnosis:  Final diagnoses:  Suicidal ideation  Psychosis, unspecified psychosis type (HCC)    Total Time spent with patient: 20 minutes  Past Psychiatric History: a past psychiatric history documented as chronic PTSD, adjustment disorder with mixed anxiety and depression, sleep disturbance and anxiety. Inpatient at [facility] years  ago.  Past Medical History: a documented history of migraines and scoliosis  Family Psychiatric  History: No known history Social History: Patient resides with a friend.   Additional Social History:    Pain Medications: See MAR Prescriptions: See MAR Over the Counter: See MAR History of alcohol / drug use?: Yes Longest period of sobriety (when/how long): UTA Negative Consequences of Use: Personal relationships, Work / Programmer, multimedia, Surveyor, quantity Withdrawal Symptoms:  (UTA) Name of Substance 1: Cocaine. 1 - Age of First Use: UTA 1 - Amount (size/oz): Pt reports, he doesn't know how much he used or the last time he's used. 1 - Frequency: UTA 1 - Duration: UTA 1 - Last Use / Amount: UTA 1 - Method of Aquiring: UTA 1- Route of Use: UTA   Current Medications:  Current Facility-Administered Medications  Medication Dose Route Frequency Provider Last Rate Last Admin   acetaminophen  (TYLENOL ) tablet 650 mg  650 mg Oral Q6H PRN Bobbitt, Shalon E, NP       alum & mag hydroxide-simeth (MAALOX/MYLANTA) 200-200-20 MG/5ML suspension 30 mL  30 mL Oral Q4H PRN Bobbitt, Shalon E, NP       haloperidol (HALDOL) tablet 5 mg  5 mg Oral TID PRN Bobbitt, Shalon E, NP       And   diphenhydrAMINE (BENADRYL) capsule 50 mg  50 mg Oral TID PRN Bobbitt, Shalon E, NP       haloperidol lactate (HALDOL) injection 5 mg  5 mg Intramuscular TID PRN Bobbitt, Shalon E, NP       And   diphenhydrAMINE (BENADRYL) injection 50 mg  50 mg Intramuscular TID PRN Bobbitt, Shalon E, NP       And  LORazepam  (ATIVAN ) injection 2 mg  2 mg Intramuscular TID PRN Bobbitt, Shalon E, NP       haloperidol lactate (HALDOL) injection 10 mg  10 mg Intramuscular TID PRN Bobbitt, Shalon E, NP       And   diphenhydrAMINE (BENADRYL) injection 50 mg  50 mg Intramuscular TID PRN Bobbitt, Shalon E, NP       And   LORazepam  (ATIVAN ) injection 2 mg  2 mg Intramuscular TID PRN Bobbitt, Shalon E, NP       hydrOXYzine  (ATARAX ) tablet 25 mg  25 mg Oral TID  PRN Bobbitt, Shalon E, NP       magnesium  hydroxide (MILK OF MAGNESIA) suspension 30 mL  30 mL Oral Daily PRN Bobbitt, Shalon E, NP       risperiDONE (RISPERDAL) tablet 0.5 mg  0.5 mg Oral QHS Bobbitt, Shalon E, NP   0.5 mg at 05/06/23 0258   Current Outpatient Medications  Medication Sig Dispense Refill   hydrOXYzine  (ATARAX ) 25 MG tablet Take 1 tablet (25 mg total) by mouth 3 (three) times daily as needed. 90 tablet 1   traZODone  (DESYREL ) 50 MG tablet Take 1 tablet (50 mg total) by mouth at bedtime. 30 tablet 0    Labs  Lab Results:  No visits with results within 6 Month(s) from this visit.  Latest known visit with results is:  Admission on 01/27/2022, Discharged on 01/27/2022  Component Date Value Ref Range Status   Sodium 01/27/2022 137  135 - 145 mmol/L Final   Potassium 01/27/2022 3.7  3.5 - 5.1 mmol/L Final   Chloride 01/27/2022 105  98 - 111 mmol/L Final   CO2 01/27/2022 25  22 - 32 mmol/L Final   Glucose, Bld 01/27/2022 79  70 - 99 mg/dL Final   Glucose reference range applies only to samples taken after fasting for at least 8 hours.   BUN 01/27/2022 17  6 - 20 mg/dL Final   Creatinine, Ser 01/27/2022 1.17  0.61 - 1.24 mg/dL Final   Calcium 30/86/5784 9.3  8.9 - 10.3 mg/dL Final   Total Protein 69/62/9528 8.1  6.5 - 8.1 g/dL Final   Albumin 41/32/4401 4.5  3.5 - 5.0 g/dL Final   AST 02/72/5366 32  15 - 41 U/L Final   ALT 01/27/2022 19  0 - 44 U/L Final   Alkaline Phosphatase 01/27/2022 64  38 - 126 U/L Final   Total Bilirubin 01/27/2022 1.1  0.3 - 1.2 mg/dL Final   GFR, Estimated 01/27/2022 >60  >60 mL/min Final   Comment: (NOTE) Calculated using the CKD-EPI Creatinine Equation (2021)    Anion gap 01/27/2022 7  5 - 15 Final   Performed at [facility], [address] [place], [place] [address]   WBC 01/27/2022 5.4  4.0 - 10.5 K/uL Final   RBC 01/27/2022 6.13 (H)  4.22 - 5.81 MIL/uL Final   Hemoglobin 01/27/2022 14.5  13.0 - 17.0 g/dL Final   HCT 74/25/9563  46.4  39.0 - 52.0 % Final   MCV 01/27/2022 75.7 (L)  80.0 - 100.0 fL Final   MCH 01/27/2022 23.7 (L)  26.0 - 34.0 pg Final   MCHC 01/27/2022 31.3  30.0 - 36.0 g/dL Final   RDW 87/56/4332 13.7  11.5 - 15.5 % Final   Platelets 01/27/2022 207  150 - 400 K/uL Final   nRBC 01/27/2022 0.0  0.0 - 0.2 % Final   Neutrophils Relative % 01/27/2022 41  % Final  Neutro Abs 01/27/2022 2.2  1.7 - 7.7 K/uL Final   Lymphocytes Relative 01/27/2022 47  % Final   Lymphs Abs 01/27/2022 2.5  0.7 - 4.0 K/uL Final   Monocytes Relative 01/27/2022 10  % Final   Monocytes Absolute 01/27/2022 0.6  0.1 - 1.0 K/uL Final   Eosinophils Relative 01/27/2022 1  % Final   Eosinophils Absolute 01/27/2022 0.1  0.0 - 0.5 K/uL Final   Basophils Relative 01/27/2022 1  % Final   Basophils Absolute 01/27/2022 0.0  0.0 - 0.1 K/uL Final   Immature Granulocytes 01/27/2022 0  % Final   Abs Immature Granulocytes 01/27/2022 0.02  0.00 - 0.07 K/uL Final   Performed at [facility], [address] [place], [place] [address]   Troponin I (High Sensitivity) 01/27/2022 11  <18 ng/L Final   Comment: (NOTE) Elevated high sensitivity troponin I (hsTnI) values and significant  changes across serial measurements may suggest ACS but many other  chronic and acute conditions are known to elevate hsTnI results.  Refer to the "Links" section for chest pain algorithms and additional  guidance. Performed at [facility], [address] [place], [place] [address]    Tricyclic, Ur Screen 01/27/2022 NONE DETECTED  NONE DETECTED Final   Amphetamines, Ur Screen 01/27/2022 NONE DETECTED  NONE DETECTED Final   MDMA (Ecstasy)Ur Screen 01/27/2022 NONE DETECTED  NONE DETECTED Final   Cocaine Metabolite,Ur [place] 01/27/2022 NONE DETECTED  NONE DETECTED Final   Opiate, Ur Screen 01/27/2022 NONE DETECTED  NONE DETECTED Final   Phencyclidine (PCP) Ur S 01/27/2022 NONE DETECTED  NONE DETECTED Final   Cannabinoid 50 Ng, Ur [place] 01/27/2022 NONE  DETECTED  NONE DETECTED Final   Barbiturates, Ur Screen 01/27/2022 NONE DETECTED  NONE DETECTED Final   Benzodiazepine, Ur Scrn 01/27/2022 NONE DETECTED  NONE DETECTED Final   Methadone Scn, Ur 01/27/2022 NONE DETECTED  NONE DETECTED Final   Comment: (NOTE) Tricyclics + metabolites, urine    Cutoff 1000 ng/mL Amphetamines + metabolites, urine  Cutoff 1000 ng/mL MDMA (Ecstasy), urine              Cutoff 500 ng/mL Cocaine Metabolite, urine          Cutoff 300 ng/mL Opiate + metabolites, urine        Cutoff 300 ng/mL Phencyclidine (PCP), urine         Cutoff 25 ng/mL Cannabinoid, urine                 Cutoff 50 ng/mL Barbiturates + metabolites, urine  Cutoff 200 ng/mL Benzodiazepine, urine              Cutoff 200 ng/mL Methadone, urine                   Cutoff 300 ng/mL  The urine drug screen provides only a preliminary, unconfirmed analytical test result and should not be used for non-medical purposes. Clinical consideration and professional judgment should be applied to any positive drug screen result due to possible interfering substances. A more specific alternate chemical method must be used in order to obtain a confirmed analytical result. Gas chromatography / mass spectrometry (GC/MS) is the preferred confirm                          atory method. Performed at [facility], [address] [place], [place] [address]    Prothrombin Time 01/27/2022 13.3  11.4 - 15.2 seconds  Final   INR 01/27/2022 1.0  0.8 - 1.2 Final   Comment: (NOTE) INR goal varies based on device and disease states. Performed at [facility], [address] [place], [place] [address]    Cholesterol 01/27/2022 153  0 - 200 mg/dL Final   Triglycerides 21/30/8657 55  <150 mg/dL Final   HDL 84/69/6295 48  >40 mg/dL Final   Total CHOL/HDL Ratio 01/27/2022 3.2  RATIO Final   VLDL 01/27/2022 11  0 - 40 mg/dL Final   LDL Cholesterol 01/27/2022 94  0 - 99 mg/dL Final   Comment:        Total  Cholesterol/HDL:CHD Risk Coronary Heart Disease Risk Table                     Men   Women  1/2 Average Risk   3.4   3.3  Average Risk       5.0   4.4  2 X Average Risk   9.6   7.1  3 X Average Risk  23.4   11.0        Use the calculated Patient Ratio above and the CHD Risk Table to determine the patient's CHD Risk.        ATP III CLASSIFICATION (LDL):  <100     mg/dL   Optimal  284-132  mg/dL   Near or Above                    Optimal  130-159  mg/dL   Borderline  440-102  mg/dL   High  >725     mg/dL   Very High Performed at [facility], [address] [place], [place] [address]    aPTT 01/27/2022 33  24 - 36 seconds Final   Performed at [facility], [address] [place], [place] [address]   Hgb A1c MFr Bld 01/27/2022 5.0  4.8 - 5.6 % Final   Comment: (NOTE) Pre diabetes:          5.7%-6.4%  Diabetes:              >6.4%  Glycemic control for   <7.0% adults with diabetes    Mean Plasma Glucose 01/27/2022 96.8  mg/dL Final   Performed at [facility], [address], [place], [place] [address]   Troponin I (High Sensitivity) 01/27/2022 7  <18 ng/L Final   Comment: (NOTE) Elevated high sensitivity troponin I (hsTnI) values and significant  changes across serial measurements may suggest ACS but many other  chronic and acute conditions are known to elevate hsTnI results.  Refer to the "Links" section for chest pain algorithms and additional  guidance. Performed at [facility], [address] [place], [place] [address]     Blood Alcohol level:  Lab Results  Component Value Date   [facility] <10 05/10/2021    Metabolic Disorder Labs: Lab Results  Component Value Date   HGBA1C 5.0 01/27/2022   MPG 96.8 01/27/2022   MPG 108.28 05/10/2021   No results found for: "PROLACTIN" Lab Results  Component Value Date   CHOL 153 01/27/2022   TRIG 55 01/27/2022   HDL 48 01/27/2022   CHOLHDL 3.2 01/27/2022   VLDL 11 01/27/2022    LDLCALC 94 01/27/2022    Therapeutic Lab Levels: No results found for: "LITHIUM" No results found for: "VALPROATE" No results found for: "CBMZ"  Physical Findings   GAD-7    Flowsheet Row  Video Visit from 06/17/2021 in [facility] Office Visit from 05/13/2021 in [facility]  Total GAD-7 Score 12 15      PHQ2-9    Flowsheet Row Office Visit from 02/02/2022 in [facility] Video Visit from 06/17/2021 in [facility] Office Visit from 05/13/2021 in [place]  PHQ-2 Total Score 2 4 4   PHQ-9 Total Score 9 14 19       Flowsheet Row ED from 05/05/2023 in [facility] ED from 01/27/2022 in [facility] Emergency Department at [facility] ED from 12/09/2021 in [facility] Emergency Department at [facility]  C-SSRS RISK CATEGORY Moderate Risk No Risk No Risk        Musculoskeletal  Strength & Muscle Tone: within normal limits Gait & Station: normal Patient leans: N/A  Psychiatric Specialty Exam  Presentation  General Appearance:  Disheveled  Eye Contact: Fair  Speech: Slow  Speech Volume: Decreased  Handedness: Right   Mood and Affect  Mood: Depressed  Affect: Congruent   Thought Process  Thought Processes: Coherent  Descriptions of Associations:Intact  Orientation:Full (Time, Place and Person)  Thought Content:Logical  Diagnosis of Schizophrenia or Schizoaffective disorder in past: No    Hallucinations:Hallucinations: Auditory Description of Auditory Hallucinations: hearing noises  Ideas of Reference:None  Suicidal Thoughts:Suicidal Thoughts: Yes, Active SI Passive Intent and/or Plan: Without Intent; Without Plan  Homicidal Thoughts:Homicidal Thoughts: No   Sensorium  Memory: Immediate Fair; Recent Fair; Remote Fair  Judgment: Poor  Insight: Poor   Executive Functions   Concentration: Fair  Attention Span: Fair  Recall: [employer] of Knowledge: Fair  Language: Fair   Psychomotor Activity  Psychomotor Activity: Psychomotor Activity: Normal   Assets  Assets: Manufacturing systems engineer; Desire for Improvement; Housing   Sleep  Sleep: Sleep: Fair Number of Hours of Sleep: 6   Nutritional Assessment (For OBS and FBC admissions only) Has the patient had a weight loss or gain of 10 pounds or more in the last 3 months?: No Has the patient had a decrease in food intake/or appetite?: No Does the patient have dental problems?: No Does the patient have eating habits or behaviors that may be indicators of an eating disorder including binging or inducing vomiting?: No Has the patient recently lost weight without trying?: 0 Has the patient been eating poorly because of a decreased appetite?: 0 Malnutrition Screening Tool Score: 0    Physical Exam  Physical Exam Cardiovascular:     Rate and Rhythm: Normal rate.  Pulmonary:     Effort: Pulmonary effort is normal.  Musculoskeletal:        General: Normal range of motion.     Cervical back: Normal range of motion.  Neurological:     Mental Status: He is alert and oriented to person, place, and time.    Review of Systems  Constitutional: Negative.   HENT: Negative.    Eyes: Negative.   Respiratory: Negative.    Cardiovascular: Negative.   Gastrointestinal: Negative.   Genitourinary: Negative.   Musculoskeletal: Negative.   Neurological: Negative.   Endo/Heme/Allergies: Negative.   Psychiatric/Behavioral:  Positive for depression and hallucinations.    Blood pressure 105/69, pulse 68, temperature 97.6 F (36.4 C), temperature source Axillary, resp. rate 18, SpO2 99%. There is no height or weight on file to calculate BMI.  Treatment Plan Summary: Patient is reccommended for inpatient psychiatric treatment for mood stabilization and medication  management. Patient is voluntary.  Pt has  been ACCEPTED to [facility] for today, Accepting MD is Dr. Caron [place].  Medication regimen  Continued Risperdal, will increase from 0.5 mg to 1 mg po at bedtime  Labs Pending collections: CBC, CMP, BAL, EKG, UDS Added COVID for placement  Vital signs are stable  Daiwik Buffalo L, NP 05/06/2023 10:20 AM

## 2023-05-06 NOTE — ED Notes (Signed)
Patient observed resting quietly, eyes closed. Respirations equal and unlabored. Will continue to monitor for safety.  

## 2023-05-07 NOTE — ED Notes (Signed)
 Report given to Dwyane Glad, RN.

## 2023-05-07 NOTE — ED Notes (Signed)
Patient observed resting quietly, eyes closed. Respirations equal and unlabored. Will continue to monitor for safety.  

## 2023-05-07 NOTE — ED Notes (Signed)
 Pt observed/assessed in recliner sleeping. RR even and unlabored, appearing in no noted distress. Environmental check complete, will continue to monitor for safety
